# Patient Record
Sex: Male | Born: 2000
Health system: Southern US, Community
[De-identification: ages and names within clinical notes are randomized; demographics above are authoritative.]

## PROBLEM LIST (undated history)

## (undated) ENCOUNTER — Emergency Department (HOSPITAL_COMMUNITY): Payer: Commercial Indemnity | Source: Home / Self Care

## (undated) DIAGNOSIS — H6690 Otitis media, unspecified, unspecified ear: Secondary | ICD-10-CM

## (undated) DIAGNOSIS — R278 Other lack of coordination: Secondary | ICD-10-CM

## (undated) DIAGNOSIS — A419 Sepsis, unspecified organism: Secondary | ICD-10-CM

## (undated) DIAGNOSIS — F902 Attention-deficit hyperactivity disorder, combined type: Principal | ICD-10-CM

## (undated) DIAGNOSIS — F909 Attention-deficit hyperactivity disorder, unspecified type: Secondary | ICD-10-CM

## (undated) DIAGNOSIS — J45909 Unspecified asthma, uncomplicated: Secondary | ICD-10-CM

## (undated) DIAGNOSIS — F819 Developmental disorder of scholastic skills, unspecified: Secondary | ICD-10-CM

## (undated) DIAGNOSIS — R413 Other amnesia: Secondary | ICD-10-CM

## (undated) HISTORY — DX: Other lack of coordination: R27.8

## (undated) HISTORY — DX: Attention-deficit hyperactivity disorder, combined type: F90.2

## (undated) HISTORY — DX: Attention-deficit hyperactivity disorder, unspecified type: F90.9

---
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
## 2021-01-02 ENCOUNTER — Other Ambulatory Visit: Payer: Self-pay

## 2021-01-03 MED ORDER — AZSTARYS 52.3-10.4 MG PO CAPS
1.0000 | ORAL_CAPSULE | Freq: Every morning | ORAL | 0 refills | Status: DC
Start: 2021-01-03 — End: 2021-01-13

## 2021-01-03 NOTE — Telephone Encounter (Signed)
Azstarys 52.3-10.4 mg daily, #30 with no RF's.RX for above e-scribed and sent to pharmacy on record  [id number] - [place], [place] - [address] AT [address] OF [address]  [place] [phone] Phone: [phone] Fax: [phone]   

## 2021-01-06 ENCOUNTER — Telehealth: Payer: Self-pay

## 2021-01-10 ENCOUNTER — Telehealth: Payer: Self-pay | Admitting: Pediatrics

## 2021-01-10 NOTE — Telephone Encounter (Signed)
  Letter for [facility] to send for prior authorization for meds.

## 2021-01-13 ENCOUNTER — Other Ambulatory Visit: Payer: Self-pay

## 2021-01-13 ENCOUNTER — Ambulatory Visit: Payer: Commercial Managed Care - PPO | Admitting: Pediatrics

## 2021-01-13 ENCOUNTER — Encounter: Payer: Self-pay | Admitting: Pediatrics

## 2021-01-13 VITALS — Ht 72.5 in | Wt 179.0 lb

## 2021-01-13 DIAGNOSIS — Z719 Counseling, unspecified: Secondary | ICD-10-CM

## 2021-01-13 DIAGNOSIS — F902 Attention-deficit hyperactivity disorder, combined type: Secondary | ICD-10-CM | POA: Diagnosis not present

## 2021-01-13 DIAGNOSIS — Z7189 Other specified counseling: Secondary | ICD-10-CM

## 2021-01-13 DIAGNOSIS — Z79899 Other long term (current) drug therapy: Secondary | ICD-10-CM | POA: Diagnosis not present

## 2021-01-13 DIAGNOSIS — R278 Other lack of coordination: Secondary | ICD-10-CM

## 2021-01-13 MED ORDER — AZSTARYS 52.3-10.4 MG PO CAPS: 1.0000 | ORAL_CAPSULE | Freq: Every morning | ORAL | 0 refills | Status: DC

## 2021-01-13 NOTE — Progress Notes (Signed)
Medication Check  Patient ID: [name]  DOB: [birth year]  MRN: [id number]  DATE:01/13/21 Andre Ferretti, DO  Accompanied by: Father Patient Lives with: mother, father, and brother age 19  HISTORY/CURRENT STATUS: Chief Complaint - Polite and cooperative and present for medical follow up for medication management of ADHD, dysgraphia and learning differences. Last follow up in person on 07/12/20 and by video on 10/01/20. Currently prescribe Azstarys 52.3 mg and Intuniv 3 mg. Reports daily medicaiton.  Off Azstarys by five days due to lapse in PA on insurance.  Approval letter received today.   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 Website emailed  Activities/ Exercise: daily Outside, goes to gym  Screen time: (phone, tablet, TV, computer): not excessive Counseled to reduce  Driving: not yet licensed, no permit.  Just planning on getting license.  MEDICAL HISTORY: Appetite: WNL   Sleep: Bedtime: 0300 usually earlier around 0100   Concerns: Initiation/Maintenance/Other: Asleep easily, sleeps through the night, feels well-rested.  No Sleep concerns.  Elimination: no concerns  Individual Medical History/ Review of Systems: Changes?  Has had on/off sickness with SOB - using an inhaler  Family Medical/ Social History: Changes? No  MENTAL HEALTH: Denies sadness, loneliness or depression.  Denies self harm or thoughts of self harm or injury. Denies  fears, worries and anxieties. Has good peer relations and is not a bully nor is victimized. Steady girl friend of three months   PHYSICAL EXAM; Vitals:   01/13/21 1509  Weight: 179 lb (81.2 kg)  Height: 6' 0.5" (1.842 m)   Body mass index is 23.94 kg/m.  General Physical Exam: Unchanged from previous exam, date:07/12/2020   ASSESSMENT:  [name] is a 20 year old with a diagnosis of ADHD/Dysgraphis that is well controlled with current medication.   Has had insurance change and unable to get azstarys due to denied PA.  Appeal letter written 01/10/21 and approved on 01/13/21. Copy to father to bring to Pharmacy to get Rx filled.  He may also wish to get reimbursed for full price of meds since January. Rising college, emailed [employer] website to family to help [name] up accommodations for next school year. Counseled to continue the following: Safe sex/reduce risk behaviors Screen time reduction Improve sleep - really, parents needs to [name] limits and schedules  ADHD stable with medication management  DIAGNOSES:    ICD-10-CM   1. ADHD (attention deficit hyperactivity disorder), combined type  F90.2     2. Dysgraphia  R27.8     3. Medication management  Z79.899     4. Patient counseled  Z71.9     5. Parenting dynamics counseling  Z71.89       RECOMMENDATIONS:  Patient Instructions  DISCUSSION: Counseled regarding the following coordination of care items:  Continue medication as directed Azstarys 52.6 every morning Intuniv 3 mg every morning RX for above e-scribed and sent to pharmacy on record  [facility][id number] - [place], [place] - [address] AT [facility] OF [address] [place] [address] Phone: [phone] Fax: [phone]  Emailed [employer] information to patient and parents.  Father verbalized understanding of all topics discussed.  NEXT APPOINTMENT:  Return in about 3 months (around 04/15/2021) for Medication Check.  Disclaimer: This documentation was generated through the use of dictation and/or voice recognition software, and as such, may contain spelling or other transcription errors. Please disregard  any inconsequential errors.  Any questions regarding the content of this documentation should be directed to the individual who electronically signed.

## 2021-01-13 NOTE — Patient Instructions (Signed)
DISCUSSION: Counseled regarding the following coordination of care items:  Continue medication as directed Azstarys 52.6 every morning Intuniv 3 mg every morning RX for above e-scribed and sent to pharmacy on record  [facility][id number] - [place], [place] - [address] AT [facility] [address] [place] [address] Phone: [phone] Fax: [phone]  Emailed [employer] information to patient and parents.

## 2021-02-11 ENCOUNTER — Other Ambulatory Visit: Payer: Self-pay

## 2021-02-12 MED ORDER — AZSTARYS 52.3-10.4 MG PO CAPS: 1.0000 | ORAL_CAPSULE | Freq: Every morning | ORAL | 0 refills | Status: DC

## 2021-02-12 NOTE — Telephone Encounter (Signed)
RX for above e-scribed and sent to pharmacy on record  [id number] - [place], [place] - [address] AT [address] OF [address] [name]  [address] Phone: [phone] Fax: [phone]   

## 2021-03-05 ENCOUNTER — Encounter: Payer: Self-pay | Admitting: Pediatrics

## 2021-03-14 ENCOUNTER — Other Ambulatory Visit: Payer: Self-pay

## 2021-03-14 ENCOUNTER — Ambulatory Visit: Payer: Commercial Managed Care - PPO | Admitting: Pulmonary Disease

## 2021-03-14 ENCOUNTER — Encounter: Payer: Self-pay | Admitting: Pulmonary Disease

## 2021-03-14 VITALS — BP 124/80 | HR 78 | Temp 98.2°F | Ht 72.0 in | Wt 183.4 lb

## 2021-03-14 DIAGNOSIS — J4520 Mild intermittent asthma, uncomplicated: Secondary | ICD-10-CM

## 2021-03-14 MED ORDER — BUDESONIDE 90 MCG/ACT IN AEPB: 2.0000 | INHALATION_SPRAY | Freq: Two times a day (BID) | RESPIRATORY_TRACT | 6 refills | Status: AC

## 2021-03-14 NOTE — Progress Notes (Signed)
@Patient  ID: , male    DOB: Sep 13, 2000, 20 y.o.   MRN: [id number]  Chief Complaint  Patient presents with   Follow-up    3 mo f/u. States his breathing has been stable since last visit.     Referring provider: [id number], DO  HPI:   20 year old with history of asthma whom we are seeing in follow up of asthma.  Using Symbicort mid dose. Doing well. No issues. Exercising without issue. Had begun to improve before resuming symbicort at last visit 12/2020.  No cough.  No nocturnal symptoms.  Not using rescue inhaler.  No complaints.  HPI at initial visit: Patient notes onset of worsening dyspnea on exertion over the last 2 to 3 months.  Associate with chest tightness.  Occasional symptoms at rest.  Almost exclusively with exertion.  Such as working out, exercise.  Has used albuterol with some improvement.  Had continued symptoms particularly when working out with friends.  PCP advised using Symbicort.  He used 1 dose of this.  Serendipitously, his symptoms started to improve a couple weeks ago around the time of this intervention.  He has not been using it.  He is back to baselin those.  Worked out several times over the last couple weeks with friends.  Does think the probably contributed to worsening of his symptoms.  PMH: seasonal allergies, asthma Surgical History: appendectomy Family History: mother with NF3 Social History: does not smoke cigarettes, Senior in high school, planning to attend [employer]   ACT:  No flowsheet data found.  MMRC: mMRC Dyspnea Scale mMRC Score  12/11/2020 1    Epworth:  No flowsheet data found.  Tests:   FENO:  No results found for: NITRICOXIDE  PFT: No flowsheet data found.  WALK:  No flowsheet data found.  Imaging: Personally reviewed and as per EMR  Lab Results: Personally reviewed, no elevated eosinophils CBC    Component Value Date/Time   WBC 13.2 11/07/2017 2120   RBC 4.62 11/07/2017 2120   HGB 13.5 11/07/2017 2120    HCT 40.3 11/07/2017 2120   PLT 189 11/07/2017 2120   MCV 87.2 11/07/2017 2120   MCH 29.2 11/07/2017 2120   MCHC 33.5 11/07/2017 2120   RDW 13.0 11/07/2017 2120   LYMPHSABS 2.3 11/07/2017 2120   MONOABS 0.9 11/07/2017 2120   EOSABS 0.1 11/07/2017 2120   BASOSABS 0.0 11/07/2017 2120    BMET    Component Value Date/Time   NA 138 11/07/2017 2120   K 3.9 11/07/2017 2120   CL 105 11/07/2017 2120   CO2 25 11/07/2017 2120   GLUCOSE 97 11/07/2017 2120   BUN 13 11/07/2017 2120   CREATININE 0.90 11/07/2017 2120   CALCIUM 9.2 11/07/2017 2120   GFRNONAA NOT CALCULATED 11/07/2017 2120   GFRAA NOT CALCULATED 11/07/2017 2120    BNP No results found for: BNP  ProBNP No results found for: PROBNP  Specialty Problems   None  No Known Allergies   There is no immunization history on file for this patient.  Past Medical History:  Diagnosis Date   ADHD (attention deficit hyperactivity disorder)    ADHD (attention deficit hyperactivity disorder), combined type 12/05/2015   Asthma    Dysgraphia 11/21/2015   Learning difficulty    Memory deficit    Otitis    Sepsis (HCC)    neonatal, hospitalized for two weeks at 20 days of age    Tobacco History: Social History   Tobacco Use  Smoking Status  Never  Smokeless Tobacco Never   Counseling given: Not Answered   Continue to not smoke  Outpatient Encounter Medications as of 03/14/2021  Medication Sig   albuterol (VENTOLIN HFA) 108 (90 Base) MCG/ACT inhaler SMARTSIG:1 Puff(s) Via Inhaler Every 4 Hours PRN   Budesonide 90 MCG/ACT inhaler Inhale 2 puffs into the lungs 2 (two) times daily.   fexofenadine (ALLEGRA) 180 MG tablet 1 tablet, stop claritin,   GuanFACINE HCl 3 MG TB24 TAKE ONE TABLET EACH DAY   Serdexmethylphen-Dexmethylphen (AZSTARYS) 52.3-10.4 MG CAPS Take 1 capsule by mouth every morning.   [DISCONTINUED] SYMBICORT 80-4.5 MCG/ACT inhaler Inhale 2 puffs into the lungs 2 (two) times daily.   No facility-administered  encounter medications on file as of 03/14/2021.     Review of Systems  Review of Systems  N/a  Physical Exam  BP 124/80 (BP Location: Right Arm, Patient Position: Sitting, Cuff Size: Normal)   Pulse 78   Temp 98.2 F (36.8 C) (Oral)   Ht 6' (1.829 m)   Wt 183 lb 6.4 oz (83.2 kg)   SpO2 97% Comment: on RA  BMI 24.87 kg/m   Wt Readings from Last 5 Encounters:  03/14/21 183 lb 6.4 oz (83.2 kg) (85 %, Z= 1.02)*  12/11/20 188 lb 9.6 oz (85.5 kg) (88 %, Z= 1.19)*  05/28/20 177 lb 14.6 oz (80.7 kg) (83 %, Z= 0.95)*  11/08/17 144 lb 2.9 oz (65.4 kg) (66 %, Z= 0.41)*  09/03/14 100 lb 1.4 oz (45.4 kg) (54 %, Z= 0.10)*   * Growth percentiles are based on CDC (Boys, 2-20 Years) data.    BMI Readings from Last 5 Encounters:  03/14/21 24.87 kg/m (74 %, Z= 0.64)*  12/11/20 25.58 kg/m (80 %, Z= 0.86)*  05/28/20 23.96 kg/m (71 %, Z= 0.54)*  11/08/17 19.55 kg/m (35 %, Z= -0.37)*   * Growth percentiles are based on CDC (Boys, 2-20 Years) data.     Physical Exam General: Well appearing, in NAD Eyes: EOMI, no icterus Neck: Supple, no JVD Cardiovascular: Regular rhythm, no murmur Pulmonary: Clear to auscultation bilaterally, normal work of breathing Abdomen: Nondistended, bowel sounds present MSK: No synovitis, joint effusion Neuro: Normal gait, no weakness Psych: Normal mood, full affect   Assessment & Plan:   Episodic DOE: in setting of known asthma, worse with pollen season. Improved slightly with albuterol. Has not required steroids. Not using Symbicort.  Improved spontaneously.  Continue Symbicort with ongoing stability and improvement.  Resolved.  Asthma: Has been stable for 3 months on LABA/ICS.  Normally would want to see longer duration of stability but given symptoms seem to spontaneously improve prior to resuming ICS/LABA, recommend stepdown to ICS alone.  If symptoms stable in neck several months anticipate stopping maintenance inhaler.   Return if symptoms worsen  or fail to improve.   Karren Burly, MD 03/14/2021

## 2021-03-14 NOTE — Patient Instructions (Addendum)
Good to see you!  Stop Symbicort  Use budesonide (same steroid as symbicort) 2 puffs twice a day. Rinse mouth after every use. I sent a new prescription for this.  Please call the office after exams and let me know how you are doing. If doing well we can plan on likely stopping the inhaler at that time.  If breathing worsens at any time please call us.

## 2021-03-17 ENCOUNTER — Other Ambulatory Visit: Payer: Self-pay | Admitting: Pediatrics

## 2021-03-17 ENCOUNTER — Other Ambulatory Visit: Payer: Self-pay

## 2021-03-17 MED ORDER — GUANFACINE HCL ER 3 MG PO TB24: ORAL_TABLET | ORAL | 0 refills | Status: DC

## 2021-03-17 MED ORDER — AZSTARYS 52.3-10.4 MG PO CAPS: 1.0000 | ORAL_CAPSULE | Freq: Every morning | ORAL | 0 refills | Status: DC

## 2021-03-17 NOTE — Telephone Encounter (Signed)
RX for above e-scribed and sent to pharmacy on record  [id number] - [place], [place] - [address] [place] [address] Phone: [phone] Fax: [phone]   

## 2021-03-17 NOTE — Telephone Encounter (Signed)
RX for above e-scribed and sent to pharmacy on record  [id number] - [place], [place] - [address] AT [address]  [place] [address] Phone: [phone] Fax: [phone]   

## 2021-04-15 ENCOUNTER — Other Ambulatory Visit: Payer: Self-pay

## 2021-04-15 MED ORDER — AZSTARYS 52.3-10.4 MG PO CAPS: 1.0000 | ORAL_CAPSULE | Freq: Every morning | ORAL | 0 refills | Status: DC

## 2021-04-15 NOTE — Telephone Encounter (Signed)
Azstarys 52.3-10. Mg daily # 30 with no RF's.RX for above e-scribed and sent to pharmacy on record  [id number] - [place], [place] - [address] AT [facility] OF [address] [place] [address] Phone: [phone] Fax: [phone]

## 2021-04-21 ENCOUNTER — Other Ambulatory Visit: Payer: Self-pay

## 2021-04-21 ENCOUNTER — Ambulatory Visit: Payer: Commercial Managed Care - PPO | Admitting: Pediatrics

## 2021-04-21 ENCOUNTER — Encounter: Payer: Self-pay | Admitting: Pediatrics

## 2021-04-21 VITALS — Ht 72.5 in | Wt 177.0 lb

## 2021-04-21 DIAGNOSIS — R278 Other lack of coordination: Secondary | ICD-10-CM | POA: Diagnosis not present

## 2021-04-21 DIAGNOSIS — Z7189 Other specified counseling: Secondary | ICD-10-CM

## 2021-04-21 DIAGNOSIS — Z79899 Other long term (current) drug therapy: Secondary | ICD-10-CM | POA: Diagnosis not present

## 2021-04-21 DIAGNOSIS — F902 Attention-deficit hyperactivity disorder, combined type: Secondary | ICD-10-CM | POA: Diagnosis not present

## 2021-04-21 DIAGNOSIS — Z719 Counseling, unspecified: Secondary | ICD-10-CM

## 2021-04-21 NOTE — Progress Notes (Signed)
Medication Check  Patient ID: [name]  DOB: [birth year]  MRN: [id number]  DATE:04/21/21 Charlane Ferretti, DO  Accompanied by: Self Patient Lives with: Parents and brother 1 at home Currently on campus traditional dormitory style  HISTORY/CURRENT STATUS: Chief Complaint - Polite and cooperative and present for medical follow up for medication management of ADHD, dysgraphia and learning differences.  Last follow-up 01/13/2021.  Currently prescribed azstarys 52.3 mg and Intuniv 3 mg daily requesting no changes.   EDUCATION: School: [employer] Year/Grade: Freshman  Dropped Spanish, will do cultural stuff instead. Has made some friends Goes to gym, making music  Service plan: DSO, and counselor  Activities/ Exercise: daily  Screen time: (phone, tablet, TV, computer): not excessive. May watch a video or two before bed  Driving: going well no issues  MEDICAL HISTORY: Appetite: WNL   Sleep: Bedtime: averages around 8 hours of sleep, less on weekend due to up late  Elimination: No concerns  Individual Medical History/ Review of Systems: Changes? :No  Family Medical/ Social History: Changes? No  MENTAL HEALTH: No concerns  PHYSICAL EXAM; Vitals:   04/21/21 0836  Weight: 177 lb (80.3 kg)  Height: 6' 0.5" (1.842 m)   Body mass index is 23.68 kg/m.  ASSESSMENT:  [name] is a 20 year old with a diagnosis of ADHD/dysgraphia with continued learning differences requiring disability services in college and support.  Recent drop of Spanish and multicultural classes.  Doing well upon his transition to independent college living.  We discussed maintaining good routines with taking medication daily and not interrupting sleep routines.  Maximizing good sleep schedules as well as life work balance.  Is assimilating well with good friend group.  I do recommend decreasing all screen time and maintaining good schedules for work, sleep, daily routines.  Good dietary choices avoiding junk food and  empty calories.  More protein rich foods.  ADHD stable with medication management Utilize school services to include counseling.  May also wish to keep in touch with a local counselor Dr. Melvyn Neth for check ins with regards to socialization and executive function maturity.  DIAGNOSES:    ICD-10-CM   1. ADHD (attention deficit hyperactivity disorder), combined type  F90.2     2. Dysgraphia  R27.8     3. Medication management  Z79.899     4. Patient counseled  Z71.9     5. Parenting dynamics counseling  Z71.89       RECOMMENDATIONS:  Patient Instructions  DISCUSSION: Counseled regarding the following coordination of care items:  Continue medication as directed Azstarys 52.3 mg every morning Intuniv 3 mg every morning Recently submitted no refills this date  Advised importance of:  Sleep Maintain good sleep routines Limited screen time (none on school nights, no more than 2 hours on weekends) Reduce all screen time Regular exercise(outside and active play) More physical active physical outside time Healthy eating (drink water, no sodas/sweet tea) Protein rich avoid junk food and empty calories     Patient verbalized understanding of all topics discussed.  NEXT APPOINTMENT:  Return in about 6 months (around 10/19/2021) for Medication Check.  Disclaimer: This documentation was generated through the use of dictation and/or voice recognition software, and as such, may contain spelling or other transcription errors. Please disregard any inconsequential errors.  Any questions regarding the content of this documentation should be directed to the individual who electronically signed.

## 2021-04-21 NOTE — Patient Instructions (Addendum)
DISCUSSION: Counseled regarding the following coordination of care items:  Continue medication as directed Azstarys 52.3 mg every morning Intuniv 3 mg every morning Recently submitted no refills this date  Advised importance of:  Sleep Maintain good sleep routines Limited screen time (none on school nights, no more than 2 hours on weekends) Reduce all screen time Regular exercise(outside and active play) More physical active physical outside time Healthy eating (drink water, no sodas/sweet tea) Protein rich avoid junk food and empty calories

## 2021-05-05 ENCOUNTER — Other Ambulatory Visit: Payer: Self-pay

## 2021-05-05 ENCOUNTER — Ambulatory Visit (INDEPENDENT_AMBULATORY_CARE_PROVIDER_SITE_OTHER): Payer: Commercial Managed Care - PPO | Admitting: Psychologist

## 2021-05-05 ENCOUNTER — Encounter: Payer: Self-pay | Admitting: Psychologist

## 2021-05-05 DIAGNOSIS — F411 Generalized anxiety disorder: Secondary | ICD-10-CM | POA: Diagnosis not present

## 2021-05-05 DIAGNOSIS — F902 Attention-deficit hyperactivity disorder, combined type: Secondary | ICD-10-CM | POA: Diagnosis not present

## 2021-05-05 DIAGNOSIS — F810 Specific reading disorder: Secondary | ICD-10-CM

## 2021-05-05 NOTE — Progress Notes (Signed)
  [facility] [address] [place] [address] Dept: [phone] Dept Fax: [phone] Loc: [phone] Loc Fax: [phone]  Psychology Therapy Session Progress Note  Patient ID: [name], male  DOB: 12-Sep-2000, 20 y.o.  MRN: [id number]  05/05/2021 Start time: 10 AM End time: 10:50 AM  Session #: In office psychotherapy session  Present: patient  Service provided: [id number] Individual Psychotherapy (45 min.)  Current Concerns: Moderate levels of anxiety secondary to relationship issues.  Girlfriend diagnosed with anorexia.  Trying to navigate a long distance relationship with her in [place] and him in [place].  Beginning to have low level panic attacks.  Current Symptoms: Anxiety and Panic attacks  Mental Status: Appearance: Well Groomed Attention: good  Motor Behavior: Normal Affect: Full Range Mood: anxious Thought Process: normal Thought Content: normal Suicidal Ideation: None Homicidal Ideation:None Orientation: time, place, and person Insight: Fair Judgement: Good  Diagnosis: Generalized anxiety disorder with panic attacks, history of ADHD, dyslexia  Long Term Treatment Goals:  1) decrease anxiety 2) resist flight/freeze response 3) identify anxiety inducing thoughts 4) use relaxation strategies (deep breathing, visualization, cognitive cueing, muscle relaxation)  1) decrease impulsivity 2) increase self-monitoring 3) increase organizational skills 4) increase time management skills 5) increased behavioral regulation 6) increase self-monitoring 7) utilized cognitive behavioral principles   Anticipated Frequency of Visits: As needed Anticipated Length of Treatment Episode: As needed  Treatment Intervention: Cognitive Behavioral therapy  Response to Treatment: Neutral  Medical Necessity: Improved patient  condition  Plan: CBT as needed  RJolene Provost 05/05/2021

## 2021-05-23 ENCOUNTER — Other Ambulatory Visit: Payer: Self-pay

## 2021-05-23 MED ORDER — AZSTARYS 52.3-10.4 MG PO CAPS: 1.0000 | ORAL_CAPSULE | Freq: Every morning | ORAL | 0 refills | Status: DC

## 2021-05-23 NOTE — Telephone Encounter (Signed)
Azstarys 52.3-10.4 mg daily, #30 with no RF's.RX for above e-scribed and sent to pharmacy on record  [id number] - [place], [place] - [address] [place] [address] Phone: [phone] Fax: [phone]   

## 2021-06-18 ENCOUNTER — Other Ambulatory Visit: Payer: Self-pay

## 2021-06-18 MED ORDER — GUANFACINE HCL ER 3 MG PO TB24: ORAL_TABLET | ORAL | 0 refills | Status: DC

## 2021-06-18 MED ORDER — AZSTARYS 52.3-10.4 MG PO CAPS: 1.0000 | ORAL_CAPSULE | Freq: Every morning | ORAL | 0 refills | Status: DC

## 2021-06-18 NOTE — Telephone Encounter (Signed)
RX for above e-scribed and sent to pharmacy on record  [id number] - [place], [place] - [address] AT [address] [place] [address] Phone: [phone] Fax: [phone]   

## 2021-08-05 ENCOUNTER — Other Ambulatory Visit: Payer: Self-pay

## 2021-08-05 MED ORDER — AZSTARYS 52.3-10.4 MG PO CAPS: 1.0000 | ORAL_CAPSULE | Freq: Every morning | ORAL | 0 refills | Status: DC

## 2021-08-05 NOTE — Telephone Encounter (Signed)
RX for above e-scribed and sent to pharmacy on record  [id number] - [place], [place] - [address] [place]  [address] Phone: [phone] Fax: [phone]   

## 2021-08-11 ENCOUNTER — Telehealth: Payer: Self-pay

## 2021-08-13 NOTE — Telephone Encounter (Signed)
Outcome °Approvedtoday °Effective from 08/11/2021 through 08/10/2022. °

## 2021-08-14 DIAGNOSIS — R7989 Other specified abnormal findings of blood chemistry: Secondary | ICD-10-CM | POA: Diagnosis not present

## 2021-08-14 DIAGNOSIS — J4541 Moderate persistent asthma with (acute) exacerbation: Secondary | ICD-10-CM | POA: Diagnosis not present

## 2021-08-19 DIAGNOSIS — Z0000 Encounter for general adult medical examination without abnormal findings: Secondary | ICD-10-CM | POA: Diagnosis not present

## 2021-08-19 DIAGNOSIS — Z1331 Encounter for screening for depression: Secondary | ICD-10-CM | POA: Diagnosis not present

## 2021-08-19 DIAGNOSIS — Z23 Encounter for immunization: Secondary | ICD-10-CM | POA: Diagnosis not present

## 2021-08-19 DIAGNOSIS — Z1339 Encounter for screening examination for other mental health and behavioral disorders: Secondary | ICD-10-CM | POA: Diagnosis not present

## 2021-08-19 DIAGNOSIS — J45909 Unspecified asthma, uncomplicated: Secondary | ICD-10-CM | POA: Diagnosis not present

## 2021-08-21 ENCOUNTER — Other Ambulatory Visit: Payer: Self-pay | Admitting: Pediatrics

## 2021-08-21 MED ORDER — AZSTARYS 52.3-10.4 MG PO CAPS: 1.0000 | ORAL_CAPSULE | Freq: Every morning | ORAL | 0 refills | Status: DC

## 2021-09-23 DIAGNOSIS — J45909 Unspecified asthma, uncomplicated: Secondary | ICD-10-CM | POA: Diagnosis not present

## 2021-09-26 ENCOUNTER — Other Ambulatory Visit: Payer: Self-pay | Admitting: Pediatrics

## 2021-09-26 NOTE — Telephone Encounter (Signed)
RX for above e-scribed and sent to pharmacy on record  [id number] - [place], [place] - [address] AT [address] [place] [address] Phone: [phone] Fax: [phone]   

## 2021-10-02 ENCOUNTER — Other Ambulatory Visit: Payer: Self-pay

## 2021-10-02 MED ORDER — AZSTARYS 52.3-10.4 MG PO CAPS: 1.0000 | ORAL_CAPSULE | Freq: Every morning | ORAL | 0 refills | Status: DC

## 2021-10-02 NOTE — Telephone Encounter (Signed)
RX for above e-scribed and sent to pharmacy on record  [id number] - [place], [place] - [address] [place] [address] Phone: [phone] Fax: [phone]   

## 2021-10-04 ENCOUNTER — Emergency Department (HOSPITAL_BASED_OUTPATIENT_CLINIC_OR_DEPARTMENT_OTHER): Payer: BC Managed Care – PPO | Admitting: Radiology

## 2021-10-04 ENCOUNTER — Other Ambulatory Visit: Payer: Self-pay

## 2021-10-04 ENCOUNTER — Emergency Department (HOSPITAL_BASED_OUTPATIENT_CLINIC_OR_DEPARTMENT_OTHER)
Admission: EM | Admit: 2021-10-04 | Discharge: 2021-10-04 | Disposition: A | Discharge status: Home / Self Care | Payer: BC Managed Care – PPO | Attending: Emergency Medicine | Admitting: Emergency Medicine

## 2021-10-04 ENCOUNTER — Emergency Department (HOSPITAL_BASED_OUTPATIENT_CLINIC_OR_DEPARTMENT_OTHER): Payer: BC Managed Care – PPO

## 2021-10-04 DIAGNOSIS — D72829 Elevated white blood cell count, unspecified: Secondary | ICD-10-CM | POA: Diagnosis not present

## 2021-10-04 DIAGNOSIS — E871 Hypo-osmolality and hyponatremia: Secondary | ICD-10-CM | POA: Insufficient documentation

## 2021-10-04 DIAGNOSIS — J45909 Unspecified asthma, uncomplicated: Secondary | ICD-10-CM | POA: Insufficient documentation

## 2021-10-04 DIAGNOSIS — Z7951 Long term (current) use of inhaled steroids: Secondary | ICD-10-CM | POA: Diagnosis not present

## 2021-10-04 DIAGNOSIS — R000 Tachycardia, unspecified: Secondary | ICD-10-CM | POA: Diagnosis not present

## 2021-10-04 DIAGNOSIS — R824 Acetonuria: Secondary | ICD-10-CM | POA: Insufficient documentation

## 2021-10-04 DIAGNOSIS — R809 Proteinuria, unspecified: Secondary | ICD-10-CM | POA: Diagnosis not present

## 2021-10-04 DIAGNOSIS — R0602 Shortness of breath: Secondary | ICD-10-CM | POA: Diagnosis not present

## 2021-10-04 DIAGNOSIS — R531 Weakness: Secondary | ICD-10-CM | POA: Diagnosis not present

## 2021-10-04 DIAGNOSIS — Z20822 Contact with and (suspected) exposure to covid-19: Secondary | ICD-10-CM | POA: Insufficient documentation

## 2021-10-04 DIAGNOSIS — J029 Acute pharyngitis, unspecified: Secondary | ICD-10-CM | POA: Insufficient documentation

## 2021-10-04 DIAGNOSIS — R197 Diarrhea, unspecified: Secondary | ICD-10-CM | POA: Diagnosis not present

## 2021-10-04 DIAGNOSIS — E860 Dehydration: Secondary | ICD-10-CM | POA: Diagnosis not present

## 2021-10-04 DIAGNOSIS — J0510 Acute epiglottitis without obstruction: Secondary | ICD-10-CM | POA: Diagnosis not present

## 2021-10-04 LAB — BASIC METABOLIC PANEL WITH GFR
Anion gap: 10 (ref 5–15)
BUN: 11 mg/dL (ref 6–20)
CO2: 27 mmol/L (ref 22–32)
Calcium: 9.7 mg/dL (ref 8.9–10.3)
Chloride: 96 mmol/L — ABNORMAL LOW (ref 98–111)
Creatinine, Ser: 1.03 mg/dL (ref 0.61–1.24)
GFR, Estimated: >60 mL/min
Glucose, Bld: 114 mg/dL — ABNORMAL HIGH (ref 70–99)
Potassium: 3.5 mmol/L (ref 3.5–5.1)
Sodium: 133 mmol/L — ABNORMAL LOW (ref 135–145)

## 2021-10-04 LAB — MONONUCLEOSIS SCREEN: Mono Screen: NEGATIVE

## 2021-10-04 LAB — URINALYSIS, ROUTINE W REFLEX MICROSCOPIC
Bilirubin Urine: NEGATIVE
Glucose, UA: NEGATIVE mg/dL
Ketones, ur: 15 mg/dL — AB
Leukocytes,Ua: NEGATIVE
Nitrite: NEGATIVE
Specific Gravity, Urine: 1.044 — ABNORMAL HIGH (ref 1.005–1.030)
pH: 6.5 (ref 5.0–8.0)

## 2021-10-04 LAB — CBC
HCT: 39.3 % (ref 39.0–52.0)
Hemoglobin: 13.4 g/dL (ref 13.0–17.0)
MCH: 29 pg (ref 26.0–34.0)
MCHC: 34.1 g/dL (ref 30.0–36.0)
MCV: 85.1 fL (ref 80.0–100.0)
Platelets: 188 K/uL (ref 150–400)
RBC: 4.62 MIL/uL (ref 4.22–5.81)
RDW: 12.4 % (ref 11.5–15.5)
WBC: 17.3 K/uL — ABNORMAL HIGH (ref 4.0–10.5)
nRBC: 0 % (ref 0.0–0.2)

## 2021-10-04 LAB — RESP PANEL BY RT-PCR (FLU A&B, COVID) ARPGX2
Influenza A by PCR: NEGATIVE
Influenza B by PCR: NEGATIVE
SARS Coronavirus 2 by RT PCR: NEGATIVE

## 2021-10-04 MED ORDER — CLINDAMYCIN HCL 150 MG PO CAPS: 150.0000 mg | ORAL_CAPSULE | Freq: Three times a day (TID) | ORAL | 0 refills | Status: DC

## 2021-10-04 MED ORDER — DEXAMETHASONE SODIUM PHOSPHATE 10 MG/ML IJ SOLN
10.0000 mg | Freq: Once | INTRAMUSCULAR | Status: AC
  Administered 2021-10-04: 10 mg via INTRAVENOUS
  Filled 2021-10-04: qty 1

## 2021-10-04 MED ORDER — ACETAMINOPHEN 325 MG PO TABS
650.0000 mg | ORAL_TABLET | Freq: Once | ORAL | Status: AC
  Administered 2021-10-04: 650 mg via ORAL
  Filled 2021-10-04: qty 2

## 2021-10-04 MED ORDER — IOHEXOL 300 MG/ML  SOLN
100.0000 mL | Freq: Once | INTRAMUSCULAR | Status: AC | PRN
  Administered 2021-10-04: 75 mL via INTRAVENOUS

## 2021-10-04 MED ORDER — SODIUM CHLORIDE 0.9 % IV BOLUS
1000.0000 mL | Freq: Once | INTRAVENOUS | Status: AC
Start: 2021-10-04 — End: 2021-10-04
  Administered 2021-10-04: 1000 mL via INTRAVENOUS

## 2021-10-04 NOTE — ED Triage Notes (Signed)
Pt reports recently having strep and completed abx. Endorses fevers, weakness and SOB. Reports recent weight loss.  ?

## 2021-10-04 NOTE — Discharge Instructions (Signed)
Please use Tylenol or ibuprofen for pain.  You may use 600 mg ibuprofen every 6 hours or 1000 mg of Tylenol every 6 hours.  You may choose to alternate between the 2.  This would be most effective.  Not to exceed 4 g of Tylenol within 24 hours.  Not to exceed 3200 mg ibuprofen 24 hours. ? ?The Tylenol should also help with fever.  Encourage you to try to eat as much as you can, drink plenty of fluids, including Pedialyte, Gatorade to make sure that your electrolytes do not become depleted.  Please take the entire course of antibiotics that I am prescribing, this particular antibiotic can cause some stomach upset, I recommend that you take it with a probiotic. ? ?If you have significant worsening of your symptoms please return to the emergency department for further evaluation or if you have a fever that does not respond to Tylenol or ibuprofen.  Otherwise I recommend that you follow-up with your primary care doctor sometime next week to ensure that your symptoms are improving.  It was a pleasure taking care of you today, I hope that you feel better soon. ?

## 2021-10-04 NOTE — ED Notes (Signed)
Patient transported to CT 

## 2021-10-04 NOTE — ED Provider Notes (Signed)
[facility] EMERGENCY DEPT Provider Note   CSN: [id number] Arrival date & time: 10/04/21  1706     History  Chief Complaint  Patient presents with   Weakness    [name] is a 21 y.o. male with a past medical history significant for asthma, and recent treatment for strep pharyngitis who presents with 15 pound weight loss over the last 3 weeks, fever, chills, feeling of weakness, and ongoing sore throat.  He also had new onset of diarrhea over the last 2 days.  He denies any blood in stool, significant abdominal pain, chest pain.  He does report that he has been feeling slightly more short of breath, has been using his inhaler more than normal.  Patient with fever of 102.1 prior to arrival, decreased to 99.4 after administration of Tylenol.   Weakness     Home Medications Prior to Admission medications   Medication Sig Start Date End Date Taking? Authorizing Provider  clindamycin (CLEOCIN) 150 MG capsule Take 1 capsule (150 mg total) by mouth 3 (three) times daily. 10/04/21  Yes Ronan Duecker H, PA-C  albuterol (VENTOLIN HFA) 108 (90 Base) MCG/ACT inhaler SMARTSIG:1 Puff(s) Via Inhaler Every 4 Hours PRN 10/03/20   [provider]  Budesonide 90 MCG/ACT inhaler Inhale 2 puffs into the lungs 2 (two) times daily. 03/14/21   Hunsucker, Lesia Sago, MD  fexofenadine (ALLEGRA) 180 MG tablet 1 tablet, stop claritin,    [provider]  GuanFACINE HCl 3 MG TB24 TAKE 1 TABLET BY MOUTH EACH DAY 09/26/21   Crump, Bobi A, NP  Serdexmethylphen-Dexmethylphen (AZSTARYS) 52.3-10.4 MG CAPS Take 1 capsule by mouth every morning. 10/02/21   Leticia Penna, NP      Allergies    Patient has no known allergies.    Review of Systems   Review of Systems  Neurological:  Positive for weakness.  All other systems reviewed and are negative.  Physical Exam Updated Vital Signs BP (!) 140/92    Pulse (!) 101    Temp 99.4 F (37.4 C) (Oral)    Resp 20    Ht 6' 0.5" (1.842 m)     Wt 77.1 kg    SpO2 99%    BMI 22.74 kg/m  Physical Exam Vitals and nursing note reviewed.  Constitutional:      General: He is not in acute distress.    Appearance: Normal appearance.     Comments: Somewhat pale, ill-appearing  HENT:     Head: Normocephalic and atraumatic.     Mouth/Throat:     Comments: Tonsils are 1-2+ bilaterally with no exudate.  Posterior oropharynx is red, uvula is midline.  I do not see any evidence of peritonsillar abscess. Eyes:     General:        Right eye: No discharge.        Left eye: No discharge.  Neck:     Comments: Patient with some swelling, tenderness on the left side of the neck consistent with cervical adenopathy, but some clinical concern for additional mass in this location especially given patient's constellation of symptoms. Cardiovascular:     Rate and Rhythm: Normal rate and regular rhythm.     Heart sounds: No murmur heard.   No friction rub. No gallop.  Pulmonary:     Effort: Pulmonary effort is normal.     Breath sounds: Normal breath sounds.     Comments: Normal breath sounds throughout, no wheezing or stridor, rhonchi or rales Abdominal:  General: Bowel sounds are normal.     Palpations: Abdomen is soft.     Comments: No tenderness to palpation of the abdomen  Lymphadenopathy:     Cervical: Cervical adenopathy present.  Skin:    General: Skin is warm and dry.     Capillary Refill: Capillary refill takes less than 2 seconds.  Neurological:     Mental Status: He is alert and oriented to person, place, and time.  Psychiatric:        Mood and Affect: Mood normal.        Behavior: Behavior normal.    ED Results / Procedures / Treatments   Labs (all labs ordered are listed, but only abnormal results are displayed) Labs Reviewed  BASIC METABOLIC PANEL - Abnormal; Notable for the following components:      Result Value   Sodium 133 (*)    Chloride 96 (*)    Glucose, Bld 114 (*)    All other components within normal  limits  CBC - Abnormal; Notable for the following components:   WBC 17.3 (*)    All other components within normal limits  URINALYSIS, ROUTINE W REFLEX MICROSCOPIC - Abnormal; Notable for the following components:   Specific Gravity, Urine 1.044 (*)    Hgb urine dipstick TRACE (*)    Ketones, ur 15 (*)    Protein, ur TRACE (*)    Bacteria, UA RARE (*)    All other components within normal limits  RESP PANEL BY RT-PCR (FLU A&B, COVID) [id number]  MONONUCLEOSIS SCREEN    EKG EKG Interpretation  Date/Time:  Saturday October 04 2021 17:19:54 EST Ventricular Rate:  112 PR Interval:  139 QRS Duration: 111 QT Interval:  335 QTC Calculation: 458 R Axis:   73 Text Interpretation: Sinus tachycardia Right atrial enlargement Probable left ventricular hypertrophy Borderline T abnormalities, inferior leads No previous ECGs available Confirmed by Vanetta Mulders [phone]) on 10/04/2021 6:07:49 PM  Radiology DG Chest 2 View  Result Date: 10/04/2021 CLINICAL DATA:  Shortness of breath EXAM: CHEST - 2 VIEW COMPARISON:  None. FINDINGS: The heart size and mediastinal contours are within normal limits. Both lungs are clear. The visualized skeletal structures are unremarkable. IMPRESSION: No acute abnormality of the lungs. Electronically Signed   By: Jearld Lesch M.D.   On: 10/04/2021 18:42   CT Soft Tissue Neck W Contrast  Result Date: 10/04/2021 CLINICAL DATA:  Epiglottitis or tonsillitis suspected. Additional history provided: Fever. 15 pound weight loss in 3 weeks. EXAM: CT NECK WITH CONTRAST TECHNIQUE: Multidetector CT imaging of the neck was performed using the standard protocol following the bolus administration of intravenous contrast. RADIATION DOSE REDUCTION: This exam was performed according to the departmental dose-optimization program which includes automated exposure control, adjustment of the mA and/or kV according to patient size and/or use of iterative reconstruction technique. CONTRAST:  66mL  OMNIPAQUE IOHEXOL 300 MG/ML  SOLN COMPARISON:  None. FINDINGS: Mildly motion degraded exam. Pharynx and larynx: Streak and beam hardening artifact arising from dental restoration partially obscures the oral cavity. Symmetric prominence of the palatine and lingual tonsils. No appreciable swelling or discrete mass elsewhere within the oral cavity, pharynx or larynx. No retropharyngeal collection. Salivary glands: No inflammation, mass, or stone. Thyroid: Subcentimeter nodule within the right thyroid lobe, not meeting consensus criteria for ultrasound follow-up based on size. Lymph nodes: Bilateral cervical lymphadenopathy. For instance, a right level 2 lymph node measures 19 mm in short axis. A left level 2 lymph node measures 18 mm  in short axis. A left level 2 lymph node measures 16 mm in short axis. Vascular: The major vascular structures of the neck are patent. Limited intracranial: No evidence of acute intracranial abnormality within the field of view. Visualized orbits: No orbital mass or acute orbital finding. Mastoids and visualized paranasal sinuses: No significant paranasal sinus disease or mastoid effusion at the imaged levels. Skeleton: No acute bony abnormality or aggressive osseous lesion. Upper chest: No consolidation within the imaged lung apices. IMPRESSION: Mildly motion degraded exam. Symmetric prominence of the palatine and lingual tonsils. Associated bilateral cervical lymphadenopathy with lymph nodes measuring up to 19 mm in short axis. This constellation of findings is nonspecific and differential considerations include acute tonsillitis/pharyngitis with reactive lymphadenopathy, infectious mononucleosis or a lymphoproliferative process (such as lymphoma). Clinical correlation is recommended. Additionally, close clinical follow-up is recommended (with imaging follow-up as warranted) to ensure resolution and exclude alternative etiologies. Electronically Signed   By: Jackey Loge D.O.   On:  10/04/2021 18:50    Procedures Procedures    Medications Ordered in ED Medications  sodium chloride 0.9 % bolus 1,000 mL ( Intravenous Stopped 10/04/21 1953)  iohexol (OMNIPAQUE) 300 MG/ML solution 100 mL (75 mLs Intravenous Contrast Given 10/04/21 1830)  dexamethasone (DECADRON) injection 10 mg (10 mg Intravenous Given 10/04/21 1957)  acetaminophen (TYLENOL) tablet 650 mg (650 mg Oral Given 10/04/21 2041)    ED Course/ Medical Decision Making/ A&P                           Medical Decision Making Amount and/or Complexity of Data Reviewed Labs: ordered. Radiology: ordered.  Risk Prescription drug management.   I discussed this case with my attending physician who cosigned this note including patient's presenting symptoms, physical exam, and planned diagnostics and interventions. Attending physician stated agreement with plan or made changes to plan which were implemented.   Attending physician assessed patient at bedside  This patient presents to the ED for concern of general malaise, weight loss, persistent sore throat, fever, chills, increased inhaler use, this involves an extensive number of treatment options, and is a complaint that carries with it a high risk of complications and morbidity. The emergent differential diagnosis prior to evaluation includes, but is not limited to, adequately treated strep infection, peritonsillar abscess, mono, epiglottitis, worsening tonsillitis with airway compromise, asthma exacerbation, pneumonia versus other.  This is not an exhaustive differential.   Past Medical History / Co-morbidities: Recent previous strep infection  Additional history: Additional history obtained from patient's father. External records from outside source obtained and reviewed including outpatient student health visit.  Physical Exam: Physical exam performed. The pertinent findings include: posterior pharynx erythema, cervical adenopathy, somewhat pale, ill-appearing  patient, but intact swallow reflex, no trismus  Lab Tests: I ordered, and personally interpreted labs.  The pertinent results include: Mild hyponatremia sodium 133, high specific gravity, ketones, and protein in urine with dehydration.  We will replete fluids.  His RVP is negative for COVID, flu.  His monoscreen is negative.  His CBC is remarkable for leukocytosis with white blood cells at 17.3.   Imaging Studies: I ordered imaging studies including CT soft tissue neck with contrast, plain film chest x-ray. I independently visualized and interpreted imaging which showed nonspecific swelling consistent with tonsillitis, pharyngitis, but cannot put a definitive diagnosis, some concern for additional changes that could be consistent with lymphoma, however patient's clinical picture seems more infectious in nature.  His chest x-ray  shows no evidence of intrathoracic abnormality. I agree with the radiologist interpretation.   Medications: I ordered medication including Decadron, fluids, Tylenol for fever, throat swelling, dehydration. Reevaluation of the patient after these medicines showed that the patient improved. I have reviewed the patients home medicines and have made adjustments as needed.  Patient looks significantly better.  Discharged with almost complete resolution of his tachycardia.  He does have some return of elevated temperature with temp at 101 again prior to discharge, we will administer Tylenol before he goes home.  He does seem responsive to Tylenol as he was afebrile on arrival after home administration.  Disposition: After consideration of the diagnostic results and the patients response to treatment, I feel that patient clinical condition is consistent with a possible persistent bacterial infection of the pharynx, with no evidence of peritonsillar abscess, epiglottitis, acute respiratory failure, trismus.  He does not appear to be having an asthma exacerbation based on my exam, however  he has had slightly increased use in his inhaler.  The Decadron that we are administering for his throat swelling should be does have any respiratory symptoms.  We will discharge with clindamycin to cover for remaining infection or inadequately treated strep. Additionally encourage close follow-up with his PCP for recheck, and return to the emergency department for worsening symptoms.  Patient and father agree to plan, discharged in stable condition at this time..   Final Clinical Impression(s) / ED Diagnoses Final diagnoses:  Pharyngitis, unspecified etiology    Rx / DC Orders ED Discharge Orders          Ordered    clindamycin (CLEOCIN) 150 MG capsule  3 times daily        10/04/21 2026              [place] Balirosperi, Kindel Rochefort H, PA-C 10/04/21 2042    Vanetta MuldersZackowski, Scott, MD 10/04/21 2256

## 2021-10-08 ENCOUNTER — Other Ambulatory Visit (HOSPITAL_COMMUNITY): Payer: Self-pay

## 2021-10-08 ENCOUNTER — Encounter: Payer: Commercial Managed Care - PPO | Admitting: Pediatrics

## 2021-10-08 ENCOUNTER — Telehealth: Payer: Self-pay

## 2021-10-08 NOTE — Telephone Encounter (Signed)
Patient Advocate Encounter ?  ?Received notification from [facility] that prior authorization for Pulmicort Flexhaler is required by his/her insurance [facility]. ?  ?PA submitted on 10/08/21 ? ?Key: [id number] ? ?Status is pending ?   ?[facility] will continue to follow: ? ?Patient Advocate ?Fax: [phone]  ?

## 2021-10-13 NOTE — Telephone Encounter (Signed)
Dr Judeth Horn please advise: ? ? ?Received a fax regarding Prior Authorization from [employer] [facility]) for PULMICORT. Authorization has been DENIED because PT HAS NOT TRIED TWO ALTERNATIVE MEDICATIONS ON THE MEMBERS FORMLARY.  ?  ?SYMBICORT, ASMANEX, FLOVENT, AND QVAR.  ?  ?ONLY SYMBICORT HAS BEEN TRIED.  ?

## 2021-10-13 NOTE — Telephone Encounter (Signed)
Received a fax regarding Prior Authorization from [employer] [facility]) for PULMICORT. Authorization has been DENIED because PT HAS NOT TRIED TWO ALTERNATIVE MEDICATIONS ON THE MEMBERS FORMLARY.  ? ?SYMBICORT, ASMANEX, FLOVENT, AND QVAR.  ? ?ONLY SYMBICORT HAS BEEN TRIED.  ?

## 2021-10-14 ENCOUNTER — Institutional Professional Consult (permissible substitution): Payer: Commercial Managed Care - PPO | Admitting: Pediatrics

## 2021-10-14 DIAGNOSIS — E871 Hypo-osmolality and hyponatremia: Secondary | ICD-10-CM | POA: Diagnosis not present

## 2021-10-14 DIAGNOSIS — D72829 Elevated white blood cell count, unspecified: Secondary | ICD-10-CM | POA: Diagnosis not present

## 2021-10-14 DIAGNOSIS — J020 Streptococcal pharyngitis: Secondary | ICD-10-CM | POA: Diagnosis not present

## 2021-10-23 DIAGNOSIS — E871 Hypo-osmolality and hyponatremia: Secondary | ICD-10-CM | POA: Diagnosis not present

## 2021-10-28 ENCOUNTER — Other Ambulatory Visit: Payer: Self-pay

## 2021-10-28 ENCOUNTER — Encounter: Payer: Self-pay | Admitting: Pediatrics

## 2021-10-28 ENCOUNTER — Ambulatory Visit (INDEPENDENT_AMBULATORY_CARE_PROVIDER_SITE_OTHER): Payer: BC Managed Care – PPO | Admitting: Pediatrics

## 2021-10-28 VITALS — BP 110/70 | HR 108 | Ht 72.0 in | Wt 181.0 lb

## 2021-10-28 DIAGNOSIS — Z79899 Other long term (current) drug therapy: Secondary | ICD-10-CM

## 2021-10-28 DIAGNOSIS — R278 Other lack of coordination: Secondary | ICD-10-CM

## 2021-10-28 DIAGNOSIS — Z719 Counseling, unspecified: Secondary | ICD-10-CM | POA: Diagnosis not present

## 2021-10-28 DIAGNOSIS — F902 Attention-deficit hyperactivity disorder, combined type: Secondary | ICD-10-CM | POA: Diagnosis not present

## 2021-10-28 MED ORDER — GUANFACINE HCL ER 3 MG PO TB24: ORAL_TABLET | ORAL | 0 refills | Status: DC

## 2021-10-28 MED ORDER — AZSTARYS 52.3-10.4 MG PO CAPS
1.0000 | ORAL_CAPSULE | Freq: Every morning | ORAL | 0 refills | Status: DC
Start: 2021-10-28 — End: 2022-01-12

## 2021-10-28 NOTE — Progress Notes (Signed)
Medication Check ? ?Patient ID: [name] ? ?DOB: [birth year]  ?MRN: [id number] ? ?DATE:10/28/21 ?Sueanne Margarita, DO ? ?Accompanied by: Self ?Patient Lives with: mother and father ?At [facility] - [address]. One roommate, one common bath with two stalls and two showers ? ?HISTORY/CURRENT STATUS: ?Chief Complaint - Polite and cooperative and present for medical follow up for medication management of ADHD, dysgraphia and  learning differences with anxiety. ?Last visit was 04/21/21 ? ?Currently prescribed Azstarys 52.3 mg every morning ?Intuniv 3 mg  every morning ? ? ?EDUCATION: ?School: [employer] Year/Grade: Freshman ?First semester - passed all classes ?Currently taking -  Sociology, [employer], music ?Dropped English - too much work due to illness ?Doing well enough, could be better ? ?Service plan: DSO ?Takes testing at office ?Used [employer] in the past ?Counseled you to discuss needs with DSO ? ?Activities/ Exercise: daily ?Goes to [employer] - daily ? ?Screen time: (phone, tablet, TV, computer): excessive ?Counseled to reduce non- essential screen time ? ?Driving: no concerns, not off campus much ? ?MEDICAL HISTORY: ?Appetite: WNL - improved once not ill   ?Sleep: Bedtime: School 2300-2400 Awakens: school mornings  0645- 0750 ?Concerns: Initiation/Maintenance/Other: Asleep easily, sleeps through the night, feels well-rested.  No Sleep concerns. ? ?Elimination: no concerns ? ?Individual Medical History/ Review of Systems: Changes? :Yes recent weight loss with illness - cough, sore throat, malaise ? ?Family Medical/ Social History: Changes? No ? ?MENTAL HEALTH: ?Denies sadness, loneliness or depression.  ?Denies self harm or thoughts of self harm or injury. ?Some fears, worries and anxieties. ?Has good peer relations and is not a bully nor is victimized. ? ? ?  10/28/2021  ?  2:25 PM  ?GAD 7 : Generalized Anxiety Score  ?Nervous, Anxious, on Edge 1  ?Control/stop worrying 1  ?Worry too much - different things 2  ?Trouble relaxing 0   ?Restless 0  ?Easily annoyed or irritable 1  ?Afraid - awful might happen 0  ?Total GAD 7 Score 5  ?Anxiety Difficulty Not difficult at all  ? ?  ? ?  10/28/2021  ?  2:26 PM  ?Depression screen PHQ 2/9  ?Decreased Interest 0  ?Down, Depressed, Hopeless 0  ?PHQ - 2 Score 0  ?Altered sleeping 0  ?Tired, decreased energy 1  ?Change in appetite 1  ?Feeling bad or failure about yourself  0  ?Trouble concentrating 2  ?Moving slowly or fidgety/restless 0  ?Suicidal thoughts 0  ?PHQ-9 Score 4  ?Difficult doing work/chores Not difficult at all  ?  ? ?PHYSICAL EXAM; ?Vitals:  ? 10/28/21 1406  ?BP: 110/70  ?Pulse: (!) 108  ?SpO2: 97%  ?Weight: 181 lb (82.1 kg)  ?Height: 6' (1.829 m)  ? ?Body mass index is 24.55 kg/m?. ? ?General Physical Exam: ?Unchanged from previous exam, date:04/21/21  ? ?Testing/Developmental Screens:  ?Adult ADHD Self Report Scale (most recent)   ? ? Adult ADHD Self-Report Scale (ASRS-v1.1) Symptom Checklist - 10/28/21 1433   ? ?  ? Part A  ? 1. How often do you have trouble wrapping up the final details of a project, once the challenging parts have been done? Rarely  2. How often do you have difficulty getting things done in order when you have to do a task that requires organization? Never   ? 3. How often do you have problems remembering appointments or obligations? Sometimes  4. When you have a task that requires a lot of thought, how often do you avoid or delay getting  started? Sometimes   ? 5. How often do you fidget or squirm with your hands or feet when you have to sit down for a long time? Sometimes  6. How often do you feel overly active and compelled to do things, like you were driven by a motor? Rarely   ?  ? Part B  ? 7. How often do you make careless mistakes when you have to work on a boring or difficult project? Rarely  8. How often do you have difficulty keeping your attention when you are doing boring or repetitive work? Never   ? 9. How often do you have difficulty concentrating on what  people say to you, even when they are speaking to you directly? Never  10. How often do you misplace or have difficulty finding things at home or at work? Rarely   ? 11. How often are you distracted by activity or noise around you? Rarely  12. How often do you leave your seat in meetings or other situations in which you are expected to remain seated? Rarely   ? 13. How often do you feel restless or fidgety? Never  14. How often do you have difficulty unwinding and relaxing when you have time to yourself? Never   ? 15. How often do you find yourself talking too much when you are in social situations? Rarely  16. When you are in a conversation, how often do you find yourself finishing the sentences of the people you are talking to, before they can finish them themselves? Never   ? 17. How often do you have difficulty waiting your turn in situations when turn taking is required? Never  18. How often do you interrupt others when they are busy? Never   ? ?  ?  ? ?  ?  ? ?  ?ASSESSMENT:  ?[name] is 89-years of age with a diagnosis of ADHD/dysgraphia with anxiety that is improved and well controlled with current medication.  No medication changes at this time.  We discussed accessing and using disability services in college. ?I discussed the need for continued screen time reduction and more physical activities.  Protein rich choices avoiding junk food and empty calories.  Maintaining good sleep and avoiding late nights. ?ADHD stable with medication management ?I spent 40 minutes on the date of service and the above activities to include counseling and education. ? ? ?DIAGNOSES:  ?  ICD-10-CM   ?1. ADHD (attention deficit hyperactivity disorder), combined type  F90.2   ?  ?2. Dysgraphia  R27.8   ?  ?3. Medication management  Z79.899   ?  ?4. Patient counseled  Z71.9   ?  ? ? ?RECOMMENDATIONS:  ?Patient Instructions  ?DISCUSSION: ?Counseled regarding the following coordination of care items: ? ?Continue medication as  directed ?Azstarys 52.3 mg every morning ?Intuniv 3 mg every morning ? ?RX for above e-scribed and sent to pharmacy on record ? ?[facility] [id number] - [place], [place] [address] [address] ?Phone: [phone] Fax: [phone] ? ?Advised importance of:  ?Sleep ?Maintain good sleep routines avoid late night ?Limited screen time (none on school nights, no more than 2 hours on weekends) ?Continue screen time reduction ?Regular exercise(outside and active play) ?Daily physical activities with skill building ?Healthy eating (drink water, no sodas/sweet tea) ?Protein rich avoiding junk and empty calories ? ? ?Additional resources for parents: ? ?[facility] -  https://childmind.org/ ?[employer] HolyTattoo.de  ? ? ? ? ? ?Patient verbalized understanding of all topics discussed. ? ?NEXT APPOINTMENT:  ?Return in about 6 months (around 04/30/2022) for Medication Check. ? ?Disclaimer: This documentation was generated through the use of dictation and/or voice recognition software, and as such, may contain spelling or other transcription errors. Please disregard any inconsequential errors.  Any questions regarding the content of this documentation should be directed to the individual who electronically signed. ? ?

## 2021-10-28 NOTE — Patient Instructions (Signed)
DISCUSSION: ?Counseled regarding the following coordination of care items: ? ?Continue medication as directed ?Azstarys 52.3 mg every morning ?Intuniv 3 mg every morning ? ?RX for above e-scribed and sent to pharmacy on record ? ?[facility][id number] - [place], [place] - [address] AT [facility] [address] ?[address] ?[place] [address] ?Phone: [phone] Fax: [phone] ? ?Advised importance of:  ?Sleep ?Maintain good sleep routines avoid late night ?Limited screen time (none on school nights, no more than 2 hours on weekends) ?Continue screen time reduction ?Regular exercise(outside and active play) ?Daily physical activities with skill building ?Healthy eating (drink water, no sodas/sweet tea) ?Protein rich avoiding junk and empty calories ? ? ?Additional resources for parents: ? ?[employer] [facility] - https://childmind.org/ ?ADDitude Magazine ThirdIncome.ca  ? ? ? ? ?

## 2021-11-20 DIAGNOSIS — R591 Generalized enlarged lymph nodes: Secondary | ICD-10-CM | POA: Diagnosis not present

## 2021-11-20 DIAGNOSIS — J020 Streptococcal pharyngitis: Secondary | ICD-10-CM | POA: Diagnosis not present

## 2021-11-20 DIAGNOSIS — J029 Acute pharyngitis, unspecified: Secondary | ICD-10-CM | POA: Diagnosis not present

## 2021-12-09 DIAGNOSIS — R591 Generalized enlarged lymph nodes: Secondary | ICD-10-CM | POA: Diagnosis not present

## 2022-01-12 ENCOUNTER — Telehealth: Payer: Self-pay | Admitting: Pediatrics

## 2022-01-12 MED ORDER — AZSTARYS 52.3-10.4 MG PO CAPS: 1.0000 | ORAL_CAPSULE | Freq: Every morning | ORAL | 0 refills | Status: DC

## 2022-01-12 NOTE — Telephone Encounter (Signed)
Mom called in for refill for Azstarys to be sent to [facility]

## 2022-01-12 NOTE — Telephone Encounter (Signed)
RX for above e-scribed and sent to pharmacy on record  [id number] - [place], [place] - [address] AT [address] [place] [address] Phone: [phone] Fax: [phone]   

## 2022-02-11 ENCOUNTER — Other Ambulatory Visit: Payer: Self-pay

## 2022-02-12 MED ORDER — AZSTARYS 52.3-10.4 MG PO CAPS: 1.0000 | ORAL_CAPSULE | Freq: Every morning | ORAL | 0 refills | Status: DC

## 2022-02-12 NOTE — Telephone Encounter (Signed)
RX for above e-scribed and sent to pharmacy on record  [id number] - [place], [place] - [address] AT [address] [place] [address] Phone: [phone] Fax: [phone]   

## 2022-03-23 ENCOUNTER — Other Ambulatory Visit: Payer: Self-pay

## 2022-03-23 MED ORDER — AZSTARYS 52.3-10.4 MG PO CAPS: 1.0000 | ORAL_CAPSULE | Freq: Every morning | ORAL | 0 refills | Status: DC

## 2022-03-23 MED ORDER — GUANFACINE HCL ER 3 MG PO TB24: ORAL_TABLET | ORAL | 1 refills | Status: DC

## 2022-03-23 NOTE — Telephone Encounter (Signed)
E-Prescribed Intuniv (guanfacine ER) 3 mg and Azstarys 52.3 directly to  [id number] - [place], [place] - [address] AT [facility] OF [address] [place] [address] Phone: [phone] Fax: [phone]

## 2022-04-22 ENCOUNTER — Other Ambulatory Visit: Payer: Self-pay

## 2022-04-22 MED ORDER — AZSTARYS 52.3-10.4 MG PO CAPS: 1.0000 | ORAL_CAPSULE | Freq: Every morning | ORAL | 0 refills | Status: DC

## 2022-04-22 NOTE — Telephone Encounter (Signed)
RX for above e-scribed and sent to pharmacy on record  [id number] - [place], [place] - [address] AT [address] [place] [address] Phone: [phone] Fax: [phone]   

## 2022-04-28 ENCOUNTER — Telehealth (INDEPENDENT_AMBULATORY_CARE_PROVIDER_SITE_OTHER): Payer: BC Managed Care – PPO | Admitting: Pediatrics

## 2022-04-28 ENCOUNTER — Encounter: Payer: Self-pay | Admitting: Pediatrics

## 2022-04-28 DIAGNOSIS — Z79899 Other long term (current) drug therapy: Secondary | ICD-10-CM

## 2022-04-28 DIAGNOSIS — Z719 Counseling, unspecified: Secondary | ICD-10-CM

## 2022-04-28 DIAGNOSIS — F902 Attention-deficit hyperactivity disorder, combined type: Secondary | ICD-10-CM | POA: Diagnosis not present

## 2022-04-28 NOTE — Progress Notes (Signed)
[facility]. [address] [place] [address] Dept: [phone] Dept Fax: [phone]  Medication Check by Caregility due to COVID-19  Patient ID:  [name]  male DOB: 2000-10-01   21 y.o.   MRN: [id number]   DATE:04/28/22  Interviewed: Cala Bradford  Location: Dorm room at [facility] -no others present Provider location: [facility] office  Virtual Visit via Video Note Connected with Cala Bradford on 04/28/22 at  9:00 AM EDT by video enabled telemedicine application and verified that I am speaking with the correct person using two identifiers.     I discussed the limitations, risks, security and privacy concerns of performing an evaluation and management service by telephone and the availability of in person appointments. I also discussed with the parent/patient that there may be a patient responsible charge related to this service. The parent/patient expressed understanding and agreed to proceed.  HISTORY OF PRESENT ILLNESS/CURRENT STATUS: [name] is being followed for medication management for ADHD. Last visit on 10/28/2021 in person  Deivi currently prescribed azstarys 52.3 mg and Intuniv 3 mg every morning  Behaviors: Doing well at school and in social relationships  EDUCATION: School: [employer] Year/Grade: Sophomore Freshman year - "not terrible".  Passed all classes.  Took two summer - gen ed, and possibly [employer] as well Currently this semester - health care management, principles of macro, environmental science Dropped math due to challenged class, taking up too much time   Has tutoring daily Counseled regarding continued use of disability services/counseling tutoring  Activities/ Exercise: daily Goes to the gym, up to three times per week No clubs or fraternities Counseled daily physical activity skill building as well as social connections groups clubs activities  Screen  time: (phone, tablet, TV, computer): non-essential, not excessive Counseled continued screen time reduction MEDICAL HISTORY: Individual Medical History/ Review of Systems: Changes? :No  Family Medical/ Social History: Changes? No   Patient Lives with: Thedore Mins - roommate, on campus. Renting a small house, has two bedrooms, should have 3 guys, but its just the two guys  MENTAL HEALTH: No concerns discussed  ASSESSMENT:  [name] is a 39-years of age with a diagnosis of ADHD that is improved and well controlled current medication.  No medication changes at this time. Counseling and education provided to the patient as indicated in the note above during this visit. Overall the ADHD stable with medication management Has Appropriate school accommodations with progress academically  DIAGNOSES:    ICD-10-CM   1. ADHD (attention deficit hyperactivity disorder), combined type  F90.2     2. Medication management  Z79.899     3. Patient counseled  Z71.9        RECOMMENDATIONS:  Patient Instructions  DISCUSSION: Counseled regarding the following coordination of care items:  Continue medication as directed Azstarys 52.3 mg every morning Intuniv 3 mg every morning   No RX today, recently submitted.  Advised importance of:  Sleep Maintain good sleep routines.  Limited screen time (none on school nights, no more than 2 hours on weekends) Decrease all screen time  Regular exercise(outside and active play) Daily physical activities  Healthy eating (drink water, no sodas/sweet tea) Protein rich, avoid junk and empty calories   Additional resources for parents:  [place] - https://childmind.org/ ADDitude Magazine [email or web address].de        NEXT APPOINTMENT:  Return in about 6 months (around 10/27/2022) for Medication Check. Please call  the office for a sooner appointment if problems arise.  Medical Decision-making:  I spent 12 minutes dedicated to the  care of this patient on the date of this encounter to include face to face time with the patient and/or parent reviewing medical records and documentation by teachers, performing and discussing the assessment and treatment plan, reviewing and explaining completed speciality labs and obtaining specialty lab samples.  The patient and/or parent was provided an opportunity to ask questions and all were answered. The patient and/or parent agreed with the plan and demonstrated an understanding of the instructions.   The patient and/or parent was advised to call back or seek an in-person evaluation if the symptoms worsen or if the condition fails to improve as anticipated.  I provided 12 minutes of video-face-to-face time during this encounter.   Completed record review for 5 minutes prior to and after the virtual visit.   Disclaimer: This documentation was generated through the use of dictation and/or voice recognition software, and as such, may contain spelling or other transcription errors. Please disregard any inconsequential errors.  Any questions regarding the content of this documentation should be directed to the individual who electronically signed.

## 2022-04-28 NOTE — Patient Instructions (Signed)
DISCUSSION: Counseled regarding the following coordination of care items:  Continue medication as directed Azstarys 52.3 mg every morning Intuniv 3 mg every morning   No RX today, recently submitted.  Advised importance of:  Sleep Maintain good sleep routines.  Limited screen time (none on school nights, no more than 2 hours on weekends) Decrease all screen time  Regular exercise(outside and active play) Daily physical activities  Healthy eating (drink water, no sodas/sweet tea) Protein rich, avoid junk and empty calories   Additional resources for parents:  [place] - https://[email or web address]/ ADDitude Magazine [email or web address]

## 2022-05-19 ENCOUNTER — Telehealth: Payer: Self-pay | Admitting: Pediatrics

## 2022-05-19 NOTE — Telephone Encounter (Signed)
Mother had emailed requesting a telephone conversation regarding medical exemption withdrawal from [facility] for [place].  Will provide letter and support to family.

## 2022-06-03 ENCOUNTER — Telehealth: Payer: Self-pay | Admitting: Pediatrics

## 2022-06-16 ENCOUNTER — Other Ambulatory Visit: Payer: Self-pay

## 2022-06-16 MED ORDER — AZSTARYS 52.3-10.4 MG PO CAPS: 1.0000 | ORAL_CAPSULE | Freq: Every morning | ORAL | 0 refills | Status: DC

## 2022-06-16 NOTE — Telephone Encounter (Signed)
RX for above e-scribed and sent to pharmacy on record  [id number] - [place], [place] - [address] [place]  [address] Phone: [phone] Fax: [phone]   

## 2022-06-24 ENCOUNTER — Ambulatory Visit (HOSPITAL_BASED_OUTPATIENT_CLINIC_OR_DEPARTMENT_OTHER): Payer: BC Managed Care – PPO | Admitting: Psychologist

## 2022-06-24 ENCOUNTER — Encounter (HOSPITAL_COMMUNITY): Payer: Self-pay | Admitting: Psychologist

## 2022-06-24 DIAGNOSIS — F988 Other specified behavioral and emotional disorders with onset usually occurring in childhood and adolescence: Secondary | ICD-10-CM

## 2022-06-24 DIAGNOSIS — F4320 Adjustment disorder, unspecified: Secondary | ICD-10-CM

## 2022-06-24 DIAGNOSIS — F810 Specific reading disorder: Secondary | ICD-10-CM

## 2022-06-24 NOTE — Progress Notes (Signed)
  [facility] [address] [place] [address] Dept: [phone] Dept Fax: [phone]  Psychology Therapy Session Progress Note  Patient ID: [name], male  DOB: 10-Mar-2001, 21 y.o.  MRN: [id number]  06/24/2022 Start time: 9 AM End time: 9:50 AM  Session #: In office psychotherapy session  Method of Visit: Face-to-Face  Present: father and patient  Service provided: 90834P Individual Psychotherapy (45 min.)  Current Concerns: ADHD, reading disorder, dysgraphia, memory deficits.  Recently withdrew from all of his college classes because of academic struggles and his inability to access appropriate accommodations.  Living at home.  Mood irritable.  Disappointed at himself.  Current Symptoms: Academic problems, Depressed Mood, and Irritability  Mental Status: Appearance: Well Groomed Attention: good  Motor Behavior: Normal Affect: Appropriate Mood: Mildly dysthymic and irritable Thought Process: normal Thought Content: normal Suicidal Ideation: None Homicidal Ideation:None Orientation: time, place, and person Insight: Fair Judgement: Fair  Diagnosis: Adjustment disorder NOS, ADHD, reading disorder, dysgraphia  Long Term Treatment Goals: Long-term goals for depression:  1) improved mood 2) increase energy level 3) increase socialization 4) decrease anhedonia 5) utilized cognitive behavioral therapy principles  1) decrease impulsivity 2) increase self-monitoring 3) increase organizational skills 4) increase time management skills 5) increased behavioral regulation 6) increase self-monitoring 7) utilized cognitive behavioral principles  Enroll in [employer] for the spring semester.  Apply for [employer] for the fall semester.  Pursue a part-time job.  Anticipated Frequency of Visits: Every other week Anticipated Length of Treatment Episode: 2 to 3 months  Treatment Intervention:  Cognitive Behavioral therapy and Supportive therapy  Response to Treatment: Neutral  Medical Necessity: Improved patient condition  Plan: CBT, pursue part-time job, attend either [employer] or [employer] for spring semester  Solara Goodchild. Jolene Provost, PhD 06/24/2022

## 2022-07-09 ENCOUNTER — Encounter (HOSPITAL_COMMUNITY): Payer: Self-pay | Admitting: Psychologist

## 2022-07-09 ENCOUNTER — Ambulatory Visit (HOSPITAL_BASED_OUTPATIENT_CLINIC_OR_DEPARTMENT_OTHER): Payer: BC Managed Care – PPO | Admitting: Psychologist

## 2022-07-09 DIAGNOSIS — F988 Other specified behavioral and emotional disorders with onset usually occurring in childhood and adolescence: Secondary | ICD-10-CM

## 2022-07-09 DIAGNOSIS — F4321 Adjustment disorder with depressed mood: Secondary | ICD-10-CM | POA: Diagnosis not present

## 2022-07-09 DIAGNOSIS — F810 Specific reading disorder: Secondary | ICD-10-CM

## 2022-07-09 NOTE — Progress Notes (Signed)
  [facility] [address] [place] [address] Dept: [phone] Dept Fax: [phone]  Psychology Therapy Session Progress Note  Patient ID: [name], male  DOB: Dec 01, 2000, 21 y.o.  MRN: [id number]  07/09/2022 Start time: 9 AM End time: 9:50 AM  Session #: In office psychotherapy session  Method of Visit: Face-to-Face  Present: father and patient  Service provided: 90834P Individual Psychotherapy (45 min.)  Current Concerns: Mild dysphoria secondary to academic difficulties necessitating withdrawing from [employer] for the semester and moving back home.  ADHD with weak and inconsistent executive functioning particularly in the areas of metacognition.  Significant learning disorder.  Irritability and anger management issues.  Disturbed sleep-wake cycle.  Current Symptoms: Academic problems, Anger, Depressed Mood, and Irritability  Mental Status: Appearance: Well Groomed Attention: good  Motor Behavior: Normal Affect: Full Range Mood: Mildly dysphoric Thought Process: normal Thought Content: normal Suicidal Ideation: None Homicidal Ideation:None Orientation: time, place, and person Insight: Fair Judgement: Fair  Diagnosis:  Adjustment disorder with mild depressed mood, ADHD, reading disorder Long Term Treatment Goals:   Long-term goals for depression:  1) improved mood 2) increase energy level 3) increase socialization 4) decrease anhedonia 5) utilized cognitive behavioral therapy principles Goals for ADHD: 1) decrease impulsivity 2) increase self-monitoring 3) increase organizational skills 4) increase time management skills 5) increased behavioral regulation 6) increase self-monitoring 7) utilized cognitive behavioral principles Goals for anger: 1) decrease anger 2) identify anger triggers 3) confront anger inducing thoughts 4) use coping strategies:  (deep breathing,  diversion, freeze frame, visualization, muscle relaxation)   Anticipated Frequency of Visits: Every 2 to 3 weeks Anticipated Length of Treatment Episode: 3 months  Treatment Intervention: Cognitive Behavioral therapy and Psychoeducation  Response to Treatment: Neutral  Medical Necessity: Improved patient condition  Plan: CBT, follow-through on behavioral activation plan, follow through on anger management plan  Beatrix Fetters, PhD 07/09/2022

## 2022-07-14 ENCOUNTER — Other Ambulatory Visit: Payer: Self-pay

## 2022-07-14 MED ORDER — AZSTARYS 52.3-10.4 MG PO CAPS: 1.0000 | ORAL_CAPSULE | Freq: Every morning | ORAL | 0 refills | Status: DC

## 2022-07-14 NOTE — Telephone Encounter (Signed)
RX for above e-scribed and sent to pharmacy on record  [id number] - [place], [place] - [address] [place] [address] Phone: [phone] Fax: [phone]   

## 2022-07-15 ENCOUNTER — Other Ambulatory Visit: Payer: Self-pay | Admitting: Pediatrics

## 2022-07-15 MED ORDER — AZSTARYS 52.3-10.4 MG PO CAPS: 1.0000 | ORAL_CAPSULE | Freq: Every morning | ORAL | 0 refills | Status: DC

## 2022-07-15 NOTE — Telephone Encounter (Signed)
RX for above e-scribed and sent to pharmacy on record  [id number] - [place], [place] - [address] [place] [address] Phone: [phone] Fax: [phone]   

## 2022-08-06 ENCOUNTER — Ambulatory Visit (HOSPITAL_BASED_OUTPATIENT_CLINIC_OR_DEPARTMENT_OTHER): Payer: 59 | Admitting: Psychologist

## 2022-08-06 ENCOUNTER — Encounter (HOSPITAL_COMMUNITY): Payer: Self-pay | Admitting: Psychologist

## 2022-08-06 DIAGNOSIS — F988 Other specified behavioral and emotional disorders with onset usually occurring in childhood and adolescence: Secondary | ICD-10-CM | POA: Diagnosis not present

## 2022-08-06 DIAGNOSIS — F4321 Adjustment disorder with depressed mood: Secondary | ICD-10-CM | POA: Diagnosis not present

## 2022-08-06 DIAGNOSIS — F810 Specific reading disorder: Secondary | ICD-10-CM

## 2022-08-06 NOTE — Progress Notes (Signed)
  [facility] [place] [address] Dept: [phone] Dept Fax: [phone]  Psychology Therapy Session Progress Note  Patient ID: [name], male  DOB: 04/28/2001, 22 y.o.  MRN: [id number]  08/06/2022 Start time: 9 AM End time: 9:50 AM  Session #: In office psychotherapy session  Method of Visit: Face-to-Face  Present: father, patient  Service provided: 90834P Individual Psychotherapy (45 min.)  Current Concerns: Mild anxiety and dysphoria significantly improved.  Anger and irritability improving as well.  ADHD with comorbid weak and inconsistent executive functioning, particularly in the area of metacognition.  Reading disorder.  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  Current Symptoms: Academic problems, Anxiety, Attention problem, Depressed Mood, and Irritability  Mental Status: Appearance: Well Groomed Attention: fair  Motor Behavior: Normal Affect: Full Range Mood: anxious Thought Process: normal Thought Content: normal Suicidal Ideation: None Homicidal Ideation:None Orientation: time, place, and person Insight: Fair Judgement: Fair  Diagnosis: Adjustment disorder with mild anxiety and depressed mood, ADHD, reading disorder/dyslexia  Long Term Treatment Goals:  1) decrease anxiety 2) resist flight/freeze response 3) identify anxiety inducing thoughts 4) use relaxation strategies (deep breathing, visualization, cognitive cueing, muscle relaxation)  Long-term goals for depression:  1) improved mood 2) increase energy level 3) increase socialization 4) decrease anhedonia 5) utilized cognitive behavioral therapy principles  1) decrease anger 2) identify anger triggers 3) confront anger inducing thoughts 4) use coping strategies:  (deep breathing, diversion, freeze frame, visualization, muscle relaxation)  1) decrease impulsivity 2) increase  self-monitoring 3) increase organizational skills 4) increase time management skills 5) increased behavioral regulation 6) increase self-monitoring 7) utilized cognitive behavioral principles   Anticipated Frequency of Visits: As needed Anticipated Length of Treatment Episode: 3 months  Treatment Intervention: Cognitive Behavioral therapy and Psychoeducation  Response to Treatment: Positive as evidenced by patient and parent report of improved sleep wake cycle, improved mood, and improved productivity.  Medical Necessity: Improved patient condition  Plan: CBT, [facility], gave copy of study strategies/time management strategies/test taking strategies handout and discussed same.  Clovis Pu, PhD 08/06/2022

## 2022-08-19 ENCOUNTER — Telehealth: Payer: Self-pay | Admitting: Pediatrics

## 2022-08-25 ENCOUNTER — Encounter: Payer: Self-pay | Admitting: Pediatrics

## 2022-08-26 ENCOUNTER — Other Ambulatory Visit: Payer: Self-pay

## 2022-08-26 MED ORDER — AZSTARYS 52.3-10.4 MG PO CAPS: 1.0000 | ORAL_CAPSULE | Freq: Every morning | ORAL | 0 refills | Status: DC

## 2022-08-26 MED ORDER — GUANFACINE HCL ER 3 MG PO TB24: ORAL_TABLET | ORAL | 1 refills | Status: AC

## 2022-08-26 NOTE — Telephone Encounter (Signed)
RX for above e-scribed and sent to pharmacy on record  [facility], [place] [address] Phone: [phone] Fax: [phone]

## 2022-08-27 ENCOUNTER — Ambulatory Visit (HOSPITAL_COMMUNITY): Payer: BC Managed Care – PPO | Admitting: Psychologist

## 2022-09-01 ENCOUNTER — Other Ambulatory Visit: Payer: Self-pay | Admitting: Pediatrics

## 2022-09-02 MED ORDER — AZSTARYS 52.3-10.4 MG PO CAPS: 1.0000 | ORAL_CAPSULE | Freq: Every morning | ORAL | 0 refills | Status: AC

## 2022-09-02 NOTE — Telephone Encounter (Signed)
RX for above e-scribed and sent to pharmacy on record  [facility], [place] [address] Phone: [phone] Fax: [phone]

## 2022-09-04 ENCOUNTER — Telehealth: Payer: Self-pay

## 2022-09-04 NOTE — Telephone Encounter (Signed)
Outcome Approved today Request Reference Number: [id number]. [employer]STARYS CAP 52.3-10. is approved through 09/05/2023. Your patient may now fill this prescription and it will be covered. Authorization Expiration Date: 09/05/2023

## 2022-09-17 ENCOUNTER — Encounter (HOSPITAL_COMMUNITY): Payer: Self-pay | Admitting: Psychologist

## 2022-09-17 ENCOUNTER — Ambulatory Visit (HOSPITAL_COMMUNITY): Payer: 59 | Admitting: Psychologist

## 2022-09-17 DIAGNOSIS — F988 Other specified behavioral and emotional disorders with onset usually occurring in childhood and adolescence: Secondary | ICD-10-CM | POA: Diagnosis not present

## 2022-09-17 DIAGNOSIS — F810 Specific reading disorder: Secondary | ICD-10-CM

## 2022-09-17 DIAGNOSIS — F4321 Adjustment disorder with depressed mood: Secondary | ICD-10-CM | POA: Diagnosis not present

## 2022-09-17 NOTE — Progress Notes (Signed)
  [facility] [place] [address] Dept: [phone] Dept Fax: [phone]  Psychology Therapy Session Progress Note  Patient ID: [name], male  DOB: 11/17/2000, 22 y.o.  MRN: [id number]   09/17/2022 Start time: 8:05 AM End time: 8:55 AM  Session #: In office psychotherapy session  Method of Visit: Face-to-Face  Present: father and patient  Service provided: 90834P Individual Psychotherapy (45 min.)  Current Concerns: Mild anxiety, dysphoria, and irritability all improved.  ADHD with weak and inconsistent executive functioning improving with coaching and tutoring.  Dyslexia and severe working memory deficits.  Current Symptoms: Anxiety, Irritability, and Organization problem  Mental Status: Appearance: Well Groomed Attention: good  Motor Behavior: Normal Affect: Full Range Mood: normal Thought Process: normal Thought Content: normal Suicidal Ideation: None Homicidal Ideation:None Orientation: time, place, and person Insight: Fair Judgement: Fair  Diagnosis: Adjustment disorder with mild dysphoria, anxiety and irritability.  ADHD, dyslexia  Long Term Treatment Goals: Goals for irritability/anger 1) decrease anger 2) identify anger triggers 3) confront anger inducing thoughts 4) use coping strategies:  (deep breathing, diversion, freeze frame, visualization, muscle relaxation)  Goals for anxiety: 1) decrease anxiety 2) resist flight/freeze response 3) identify anxiety inducing thoughts 4) use relaxation strategies (deep breathing, visualization, cognitive cueing, muscle relaxation)   Long-term goals for depression: 1) improved mood 2) increase energy level 3) increase socialization 4) decrease anhedonia 5) utilized cognitive behavioral therapy principles  Goals for ADHD 1) decrease impulsivity 2) increase self-monitoring 3) increase organizational  skills 4) increase time management skills 5) increased behavioral regulation 6) increase self-monitoring 7) utilized cognitive behavioral principles   Anticipated Frequency of Visits: Every 2 -3 weeks Anticipated Length of Treatment Episode: 3 months  Treatment Intervention: Cognitive Behavioral therapy and Psychoeducation  Response to Treatment: Positive as evidenced by patient and parent report of good transition to [employer] with good grades.  Also, as evidenced by patient report of improved mood and productivity.  Medical Necessity: Improved patient condition  Plan: CBT, continue med management with MD, continue academic tutoring.  Clovis Pu, PhD 09/17/2022

## 2022-09-21 ENCOUNTER — Telehealth (INDEPENDENT_AMBULATORY_CARE_PROVIDER_SITE_OTHER): Payer: Self-pay

## 2022-09-21 NOTE — Telephone Encounter (Signed)
Last seen 09/17/22 by a Dr. Eloise Harman with [facility]-    Guanfacine last ordered by Janifer Adie NP on 08/26/2012 90 day supply with 1 refill.   Fax from pharm requesting refill but this should not be needed. Message left for pharm.about 1/24 rx and should not need a refill for about 3 more months.

## 2022-09-25 ENCOUNTER — Encounter: Payer: BC Managed Care – PPO | Admitting: Pediatrics

## 2022-10-01 ENCOUNTER — Ambulatory Visit (HOSPITAL_BASED_OUTPATIENT_CLINIC_OR_DEPARTMENT_OTHER): Payer: 59 | Admitting: Psychologist

## 2022-10-01 ENCOUNTER — Encounter (HOSPITAL_COMMUNITY): Payer: Self-pay | Admitting: Psychologist

## 2022-10-01 DIAGNOSIS — F810 Specific reading disorder: Secondary | ICD-10-CM | POA: Diagnosis not present

## 2022-10-01 DIAGNOSIS — F988 Other specified behavioral and emotional disorders with onset usually occurring in childhood and adolescence: Secondary | ICD-10-CM

## 2022-10-01 NOTE — Progress Notes (Signed)
  [facility] [place] [address] Dept: [phone] Dept Fax: [phone]  Psychology Therapy Session Progress Note  Patient ID: [name], male  DOB: [birth year], 22 y.o.  MRN: [id number]  10/01/2022 Start time: 8:05 AM End time: 8:55 AM  Session #: In office psychotherapy session  Method of Visit: Face-to-Face  Present: father and patient  Service provided: 90834P Individual Psychotherapy (45 min.)  Current Concerns: Mild depression and anxiety significantly improved.  ADHD with weak and inconsistent executive functioning, particularly in the area of metacognition also improving with academic coaching and tutoring.  Reading disorder  Current Symptoms: Academic problems, Attention problem, Irritability, and Organization problem  Mental Status: Appearance: Well Groomed Attention: good  Motor Behavior: Normal Affect: Full Range Mood: normal Thought Process: normal Thought Content: normal Suicidal Ideation: None Homicidal Ideation:None Orientation: time, place, and person Insight: Fair Judgement: Fair  Diagnosis: Adjustment disorder with anxiety and depression significantly improved.  ADHD, reading disorder  Long Term Treatment Goals:  For ADHD: 1) decrease impulsivity 2) increase self-monitoring 3) increase organizational skills 4) increase time management skills 5) increased behavioral regulation 6) increase self-monitoring 7) utilized cognitive behavioral principles  Regarding anxiety: 1) decrease anxiety 2) resist flight/freeze response 3) identify anxiety inducing thoughts 4) use relaxation strategies (deep breathing, visualization, cognitive cueing, muscle relaxation)  Long-term goals for depression:  1) improved mood 2) increase energy level 3) increase socialization 4) decrease anhedonia 5) utilized cognitive behavioral therapy  principles    Anticipated Frequency of Visits: Every 2 to 3 weeks Anticipated Length of Treatment Episode: 3 months  Treatment Intervention: Cognitive Behavioral therapy and Psychoeducation  Response to Treatment: Positive as evidenced by patient and parent report of improved mood, decreased anger/irritability, positive academic performance (A/B's for midterm grades).  Medical Necessity: Improved patient condition  Plan: CBT, continue academic coaching/tutoring, continue medication consultation with PCP, complete college transfer applications  Seth Friedlander. Eloise Harman, PhD 10/01/2022

## 2022-10-22 ENCOUNTER — Ambulatory Visit (HOSPITAL_BASED_OUTPATIENT_CLINIC_OR_DEPARTMENT_OTHER): Payer: 59 | Admitting: Psychologist

## 2022-10-22 DIAGNOSIS — F988 Other specified behavioral and emotional disorders with onset usually occurring in childhood and adolescence: Secondary | ICD-10-CM

## 2022-10-22 DIAGNOSIS — F4321 Adjustment disorder with depressed mood: Secondary | ICD-10-CM | POA: Diagnosis not present

## 2022-10-22 DIAGNOSIS — F810 Specific reading disorder: Secondary | ICD-10-CM

## 2022-10-22 NOTE — Progress Notes (Signed)
  [facility] [place] [address] Dept: [phone] Dept Fax: [phone]  Psychology Therapy Session Progress Note  Patient ID: [name], male  DOB: 07/22/2001, 22 y.o.  MRN: [id number]  10/22/2022 Start time: 8:05 AM End time: 8:55 AM  Session #: In office psychotherapy session  Method of Visit: Face-to-Face  Present: father and patient  Service provided: 90834P Individual Psychotherapy (45 min.)  Current Concerns: Mild dysphoria secondary to relationship issues.  ADHD with weak and inconsistent executive functioning particularly in the area of metacognition improving with academic coaching and tutoring.  Chronic irritability has lessened.  Reading disorder.  Current Symptoms: Attention problem, Depressed Mood, and Organization problem  Mental Status: Appearance: Well Groomed Attention: good  Motor Behavior: Normal Affect: Full Range Mood: dysthymic Thought Process: normal Thought Content: normal Suicidal Ideation: None Homicidal Ideation:None Orientation: time, place, and person Insight: Fair Judgement: Good  Diagnosis: Adjustment disorder with mild dysthymia, ADHD, reading disorder  Long Term Treatment Goals:  Long-term goals for depression:  1) improved mood 2) increase energy level 3) increase socialization 4) decrease anhedonia 5) utilized cognitive behavioral therapy principles  Goals for ADHD:  1) decrease impulsivity 2) increase self-monitoring 3) increase organizational skills 4) increase time management skills 5) increased behavioral regulation 6) increase self-monitoring 7) utilized cognitive behavioral principles    Anticipated Frequency of Visits: Every 2 to 3 weeks Anticipated Length of Treatment Episode: 4 months  Treatment Intervention: Cognitive Behavioral therapy and Supportive therapy  Response to Treatment: Positive as evidenced  by patient and parent report of continued positive academic performance.  Medical Necessity: Improved patient condition  Plan: CBT, discussed transition to PGY 3 resident in July, continue medication consultation/management with PCP  Jadon Ressler. Eloise Harman, PhD 10/22/2022

## 2022-10-24 IMAGING — DX DG CHEST 2V
2 series · 2 of 2 positions shown · non-contrast
Comparison: None.

CLINICAL DATA: Shortness of breath

EXAM:
CHEST - 2 VIEW

[chest pa]
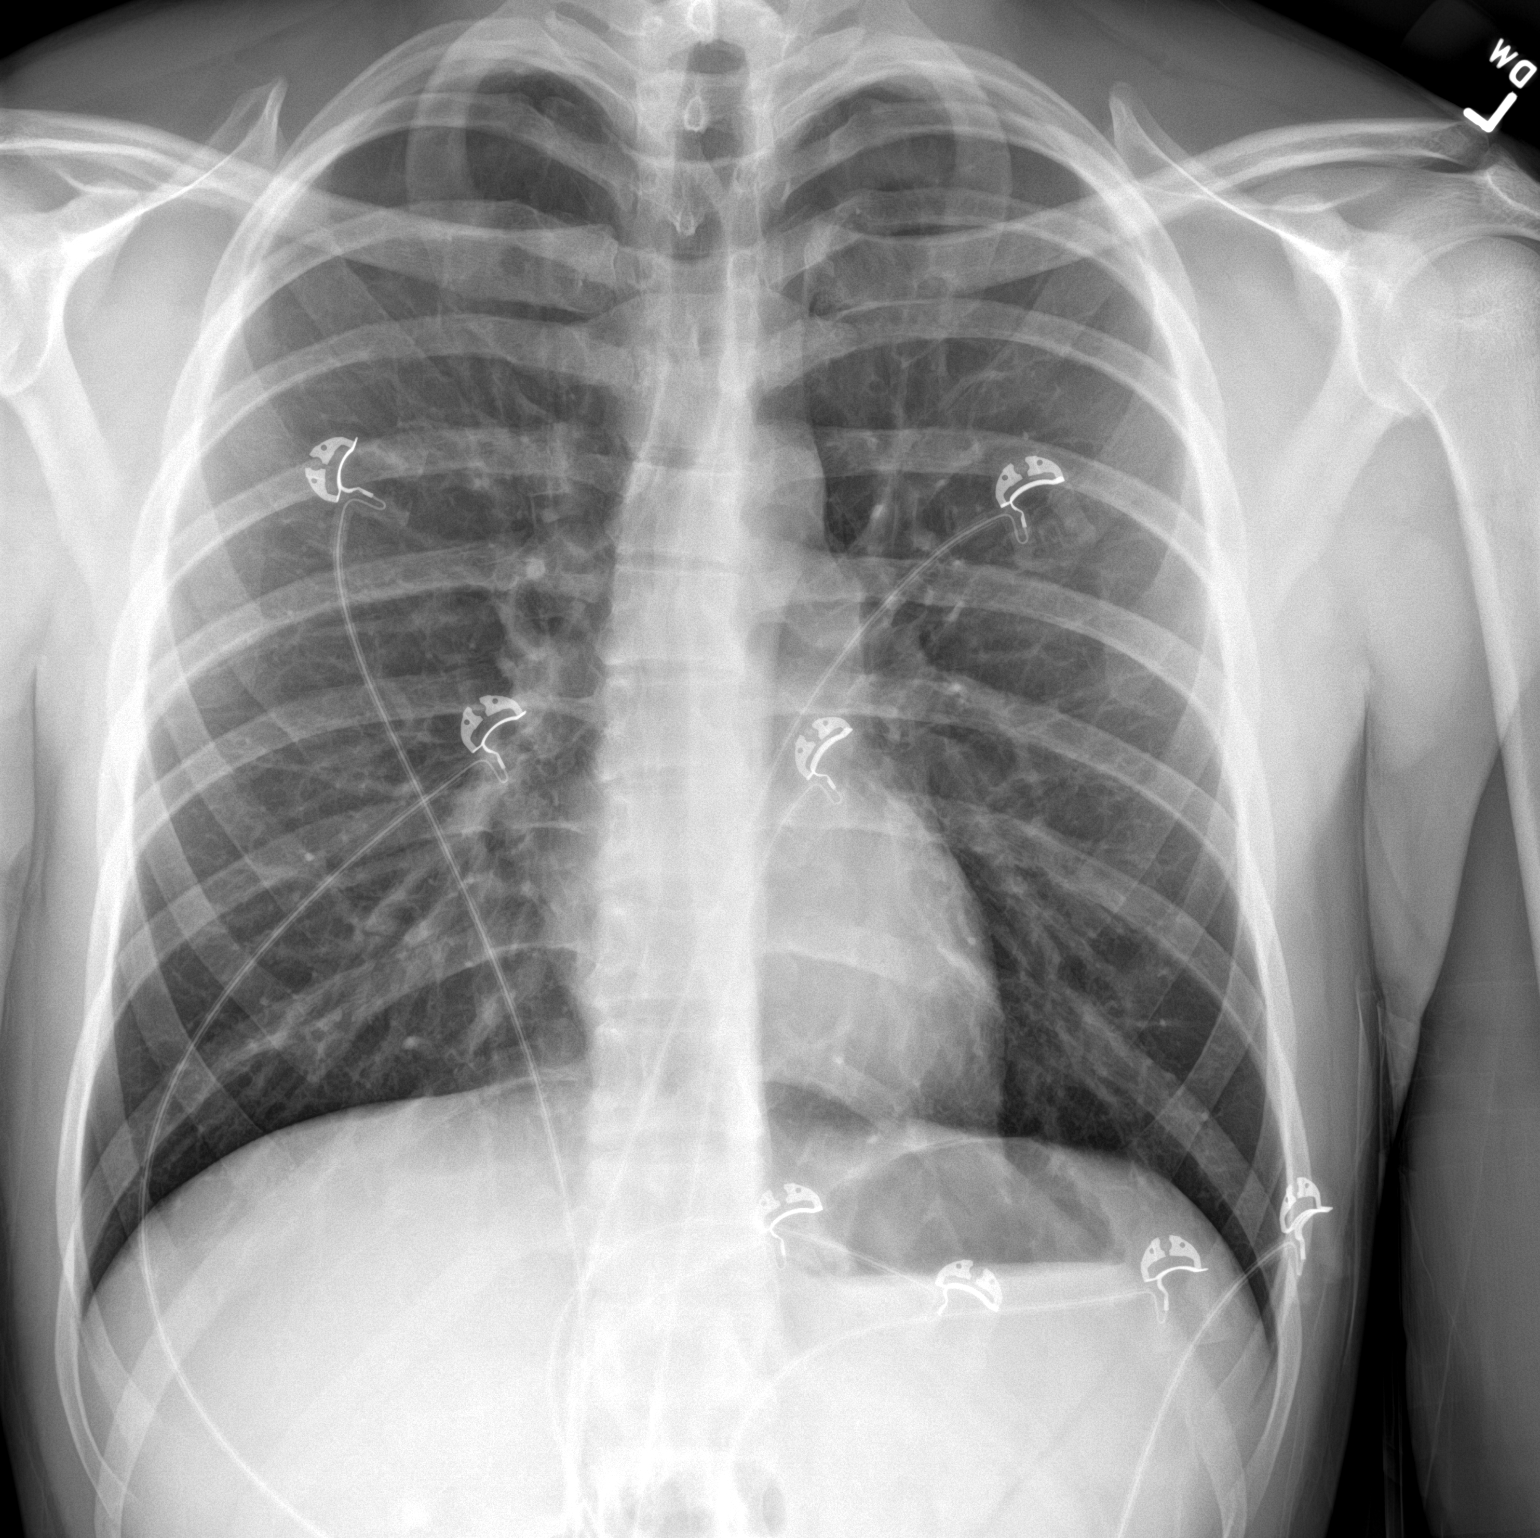

[chest lat]
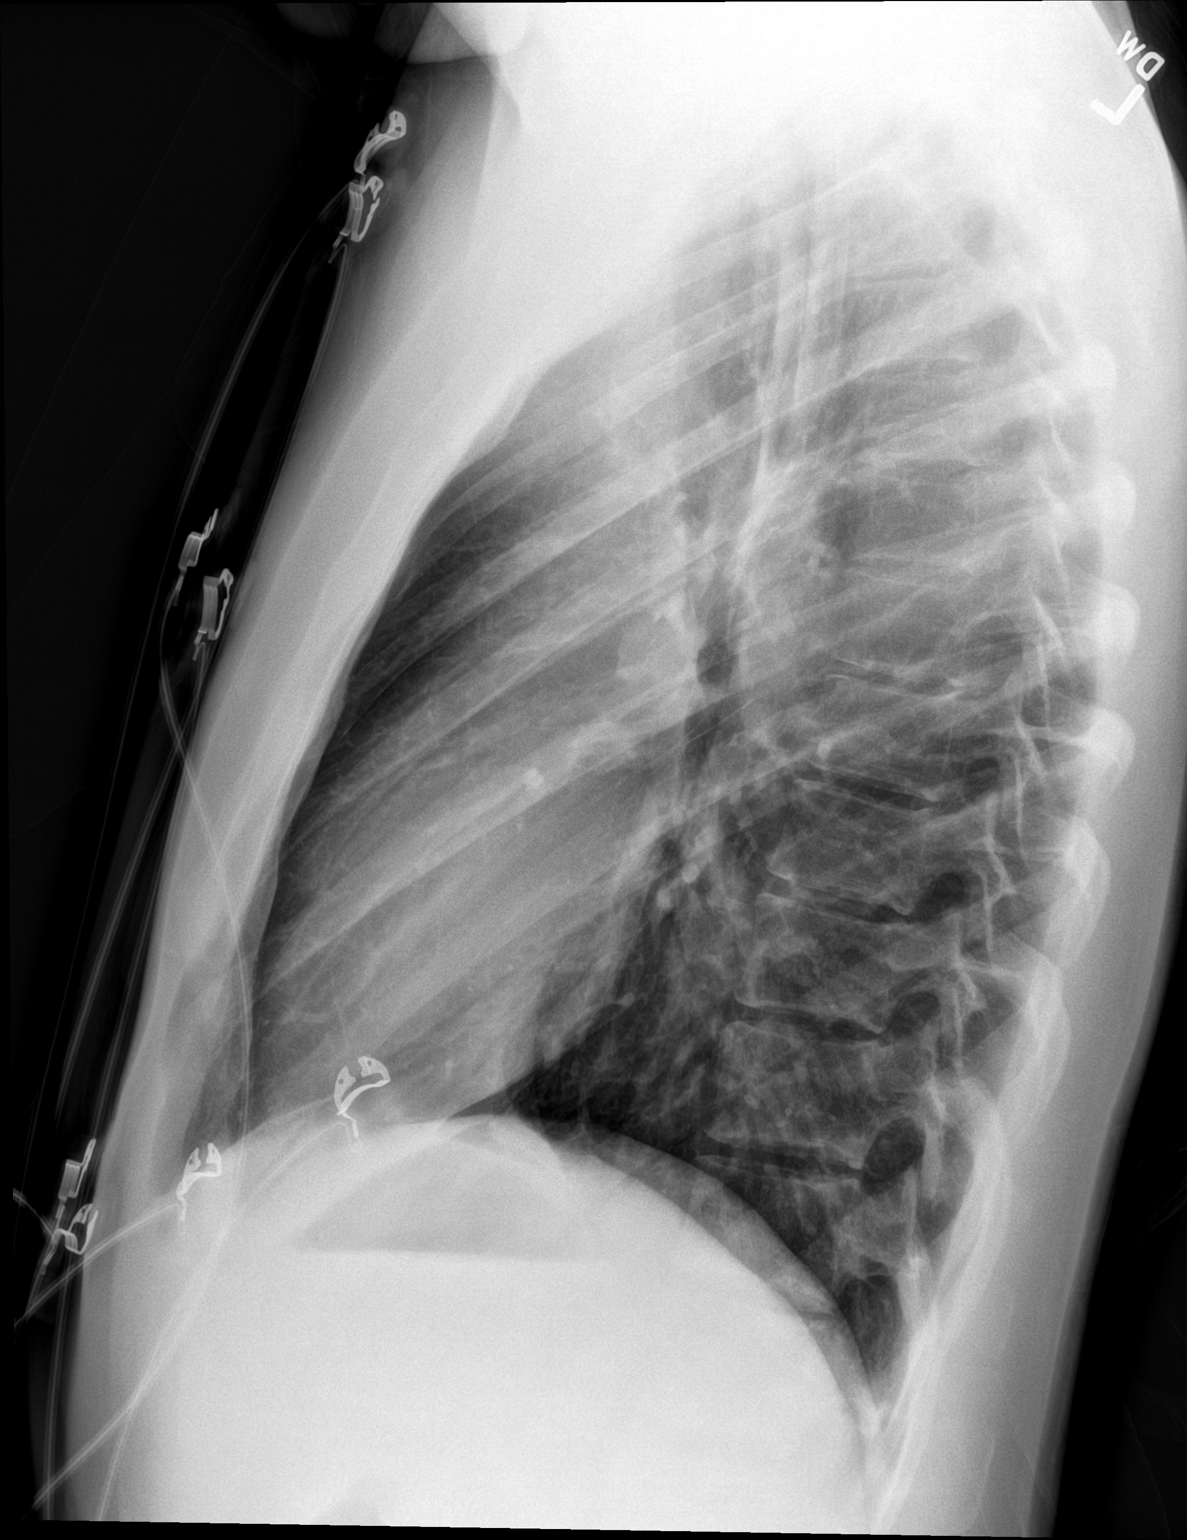

[2 of 2 positions shown; findings below may reference images not displayed]

FINDINGS: The heart size and mediastinal contours are within normal limits.
Both lungs are clear. The visualized skeletal structures are
unremarkable.
IMPRESSION: No acute abnormality of the lungs.

## 2022-10-24 IMAGING — CT CT NECK W/ CM
3 of 5 series · 11 of 33 positions shown, 13 images · IV contrast (APPLIED)
Comparison: None.

CLINICAL DATA: Epiglottitis or tonsillitis suspected. Additional
history provided: Fever. 15 pound weight loss in 3 weeks.

EXAM:
CT NECK WITH CONTRAST
TECHNIQUE: Multidetector CT imaging of the neck was performed using the
standard protocol following the bolus administration of intravenous
contrast.

[Series 4: cor neck · coronal · 0.56mm/px · 3 of 105 slices shown]
[im 31/105  bone]
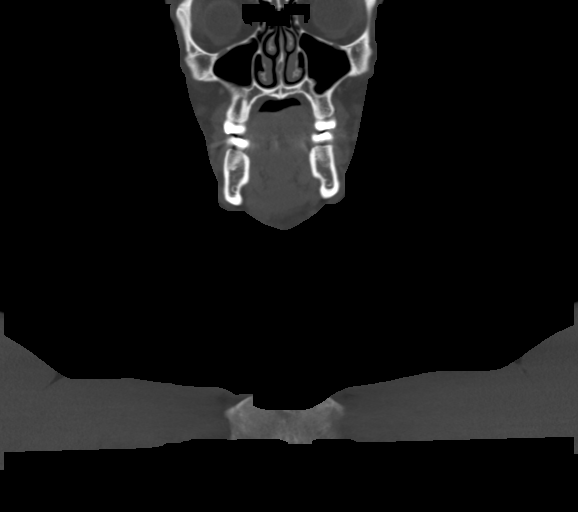
[im 45/105  bone]
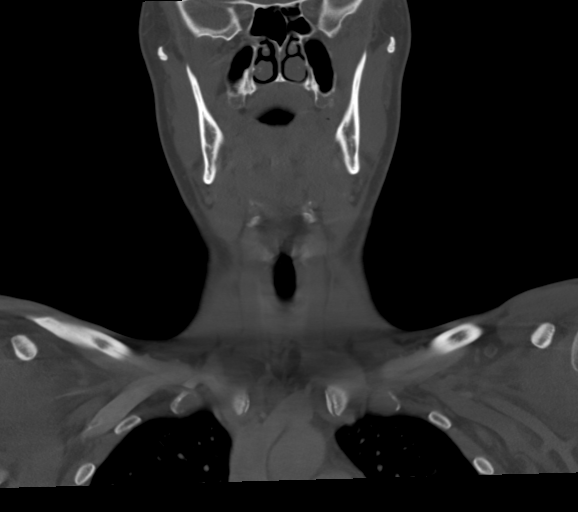
[im 60/105  bone]
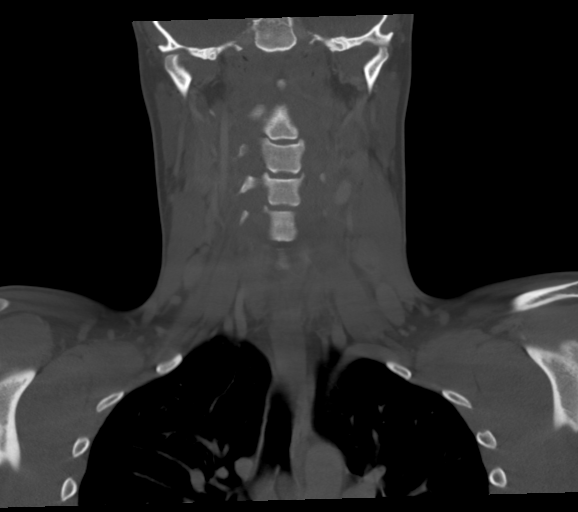

[Series 5: sag neck · sagittal · 0.41mm/px · 5 of 133 slices shown, 6 images]
[im 45/133  bone]
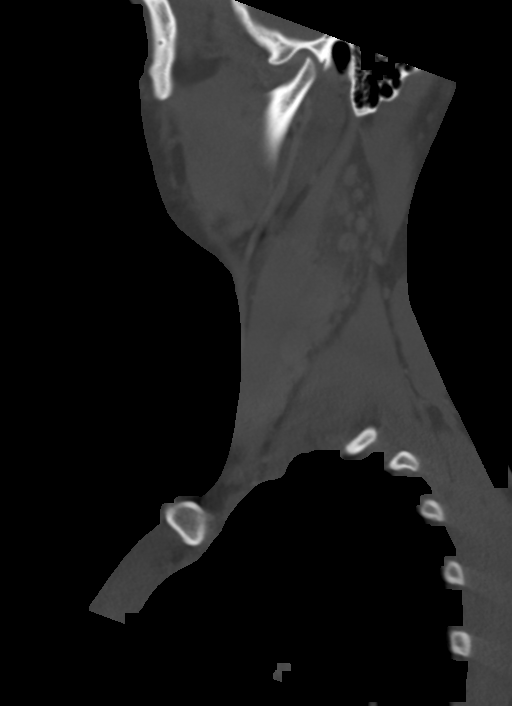
[im 56/133  bone]
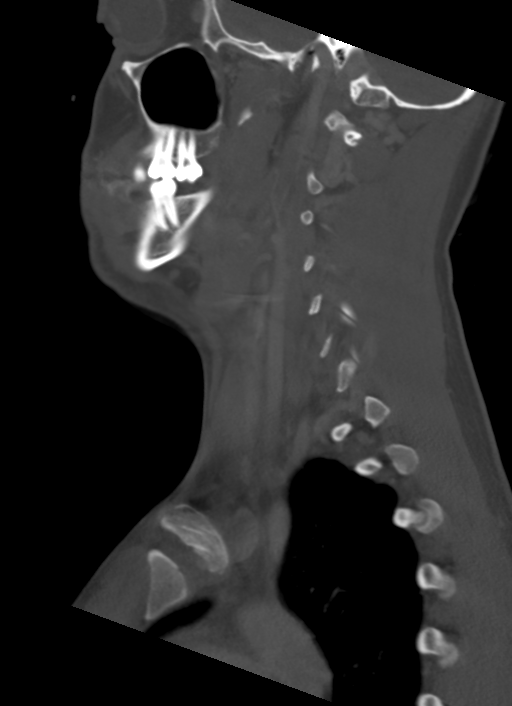
[im 67/133  soft-tissue]
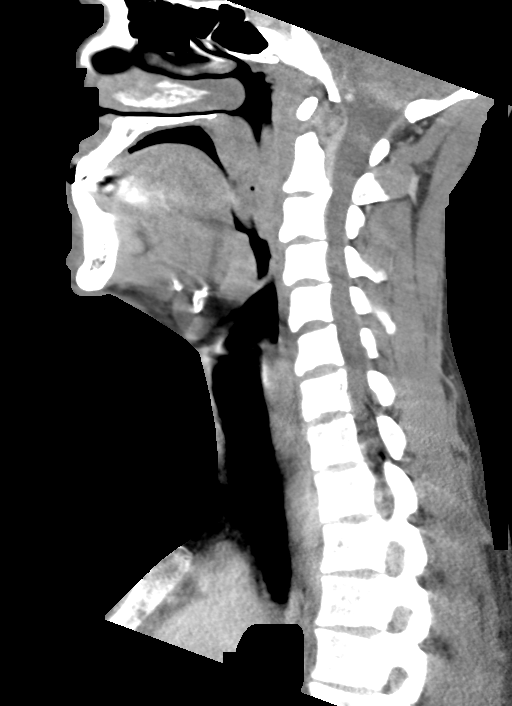
[im 67/133  bone]
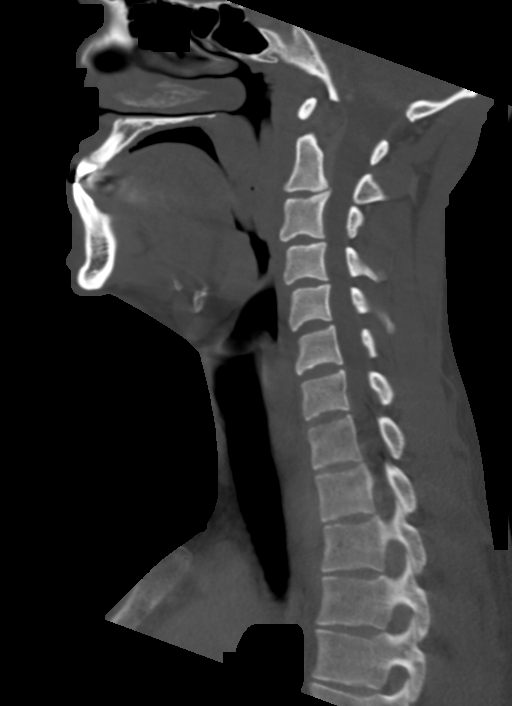
[im 78/133  bone]
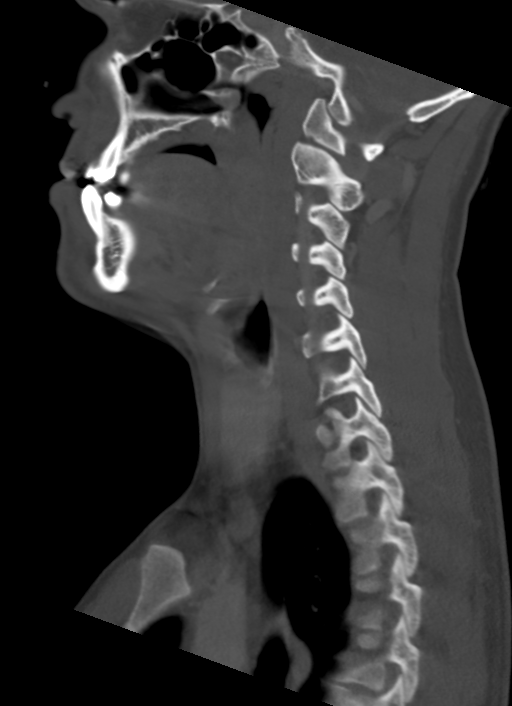
[im 89/133  bone]
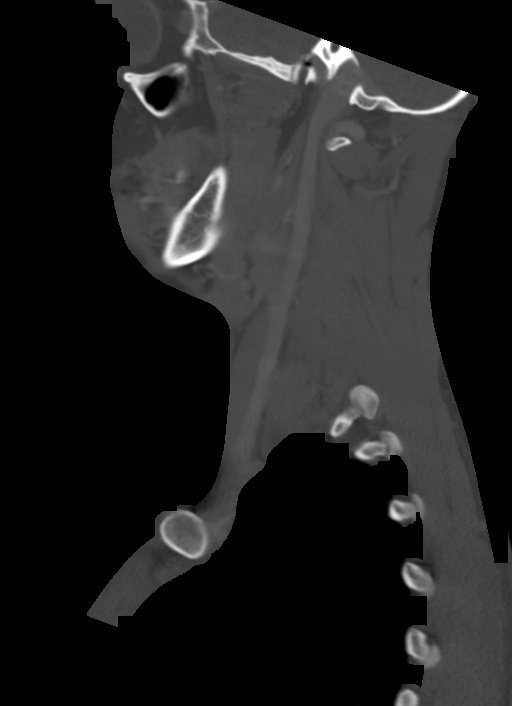

[Series 6: ax oropharynx · axial · 0.41mm/px · z∈[-277,-142]mm · 3 of 145 slices shown, 4 images]
[im 37/145  soft-tissue]
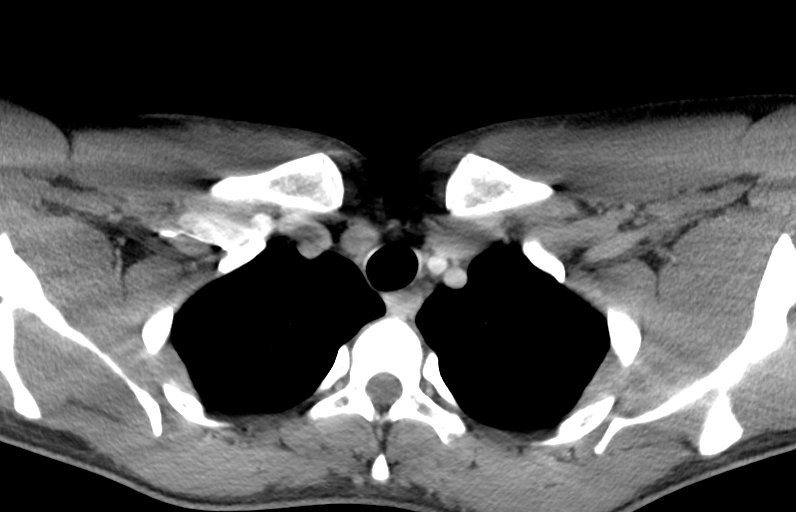
[im 37/145  bone]
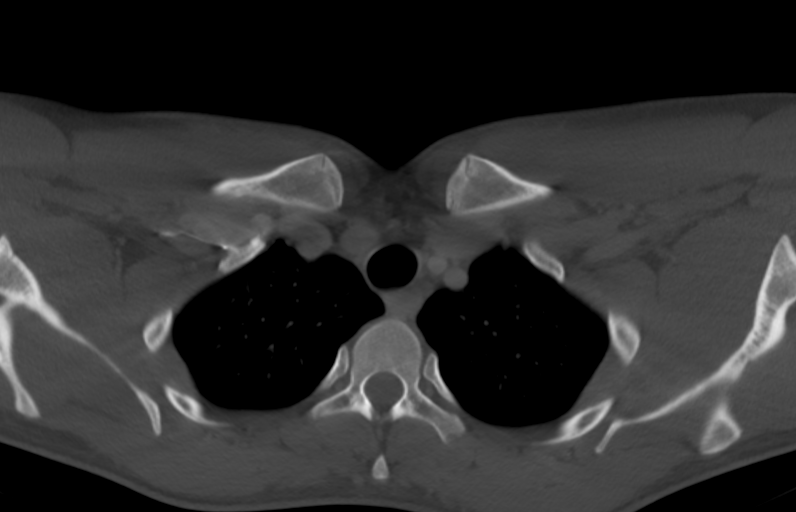
[im 73/145  bone]
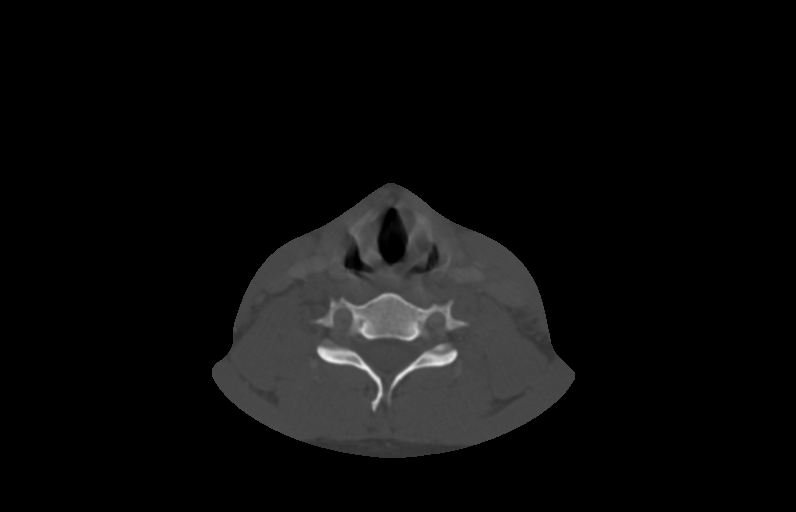
[im 109/145  bone]
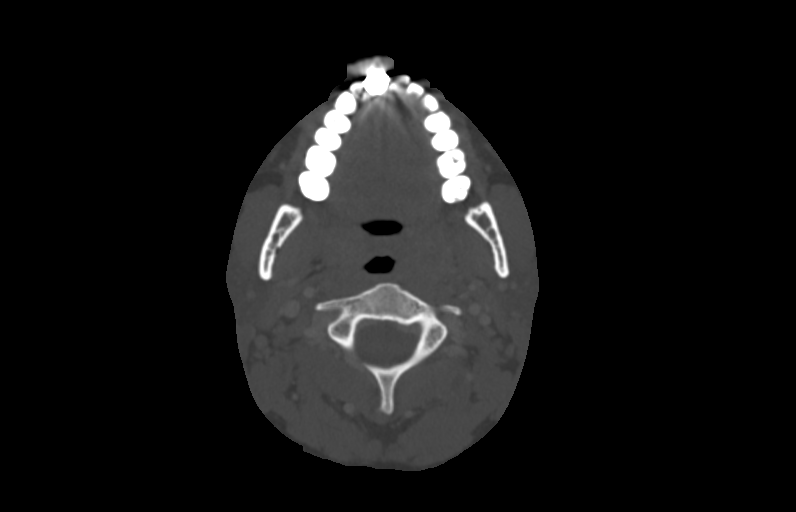

[11 of 33 positions shown; findings below may reference images not displayed]

RADIATION DOSE REDUCTION: This exam was performed according to the
departmental dose-optimization program which includes automated
exposure control, adjustment of the mA and/or kV according to
patient size and/or use of iterative reconstruction technique.

CONTRAST:  75mL OMNIPAQUE IOHEXOL 300 MG/ML  SOLN
FINDINGS: Mildly motion degraded exam.

Pharynx and larynx: Streak and beam hardening artifact arising from
dental restoration partially obscures the oral cavity. Symmetric
prominence of the palatine and lingual tonsils. No appreciable
swelling or discrete mass elsewhere within the oral cavity, pharynx
or larynx. No retropharyngeal collection.

Salivary glands: No inflammation, mass, or stone.

Thyroid: Subcentimeter nodule within the right thyroid lobe, not
meeting consensus criteria for ultrasound follow-up based on size.

Lymph nodes: Bilateral cervical lymphadenopathy. For instance, a
right level 2 lymph node measures 19 mm in short axis. A left level
2 lymph node measures 18 mm in short axis. A left level 2 lymph node
measures 16 mm in short axis.

Vascular: The major vascular structures of the neck are patent.

Limited intracranial: No evidence of acute intracranial abnormality
within the field of view.

Visualized orbits: No orbital mass or acute orbital finding.

Mastoids and visualized paranasal sinuses: No significant paranasal
sinus disease or mastoid effusion at the imaged levels.

Skeleton: No acute bony abnormality or aggressive osseous lesion.

Upper chest: No consolidation within the imaged lung apices.
IMPRESSION: Mildly motion degraded exam.

Symmetric prominence of the palatine and lingual tonsils. Associated
bilateral cervical lymphadenopathy with lymph nodes measuring up to
19 mm in short axis. This constellation of findings is nonspecific
and differential considerations include acute
tonsillitis/pharyngitis with reactive lymphadenopathy, infectious
mononucleosis or a lymphoproliferative process (such as lymphoma).
Clinical correlation is recommended. Additionally, close clinical
follow-up is recommended (with imaging follow-up as warranted) to
ensure resolution and exclude alternative etiologies.

## 2022-11-12 ENCOUNTER — Ambulatory Visit (HOSPITAL_BASED_OUTPATIENT_CLINIC_OR_DEPARTMENT_OTHER): Payer: 59 | Admitting: Psychologist

## 2022-11-12 ENCOUNTER — Encounter (HOSPITAL_COMMUNITY): Payer: Self-pay | Admitting: Psychologist

## 2022-11-12 DIAGNOSIS — F988 Other specified behavioral and emotional disorders with onset usually occurring in childhood and adolescence: Secondary | ICD-10-CM | POA: Diagnosis not present

## 2022-11-12 DIAGNOSIS — F4321 Adjustment disorder with depressed mood: Secondary | ICD-10-CM | POA: Diagnosis not present

## 2022-11-12 DIAGNOSIS — F810 Specific reading disorder: Secondary | ICD-10-CM

## 2022-11-12 NOTE — Progress Notes (Signed)
  [facility] [address] [place] [address] Dept: [phone] Dept Fax: [phone]  Psychology Therapy Session Progress Note  Patient ID: [name], male  DOB: 2001-05-28, 21 y.o.  MRN: [id number]  11/12/2022 Start time: 8 AM End time: 8:50 AM  Session #: In office psychotherapy session  Method of Visit: Face-to-Face  Present: father and patient  Service provided: 90834P Individual Psychotherapy (45 min.)  Current Concerns: Mild depression and anxiety significantly improved and in remission.  Academic struggles also significantly improved.  Conflict with best friend resolved.  Still waiting to hear regarding transferring universities for the fall.  Current Symptoms: Attention problem and Organization problem  Mental Status: Appearance: Well Groomed Attention: good  Motor Behavior: Normal Affect: Full Range Mood: normal Thought Process: normal Thought Content: normal Suicidal Ideation: None Homicidal Ideation:None Orientation: time, place, and person Insight: Fair Judgement: Good  Diagnosis: Adjustment disorder in remission, ADHD, dyslexia  Long Term Treatment Goals:  Treatment goals for ADHD: 1) decrease impulsivity 2) increase self-monitoring 3) increase organizational skills 4) increase time management skills 5) increased behavioral regulation 6) increase self-monitoring 7) utilized cognitive behavioral principles   Anticipated Frequency of Visits: Monthly Anticipated Length of Treatment Episode: 2 to 3 months  Treatment Intervention: Cognitive Behavioral therapy and Supportive therapy  Response to Treatment: Positive as evidenced by improved mood, as evidenced by all A's and his 5 college classes heading into exams, as evidenced by increased emotional equanimity.  Medical Necessity: Improved patient condition  Plan: CBT as needed, will transition treatment to rising  PGY 3 resident in July  Richardson Dubree. Mark Feras Gardella, PhD 11/12/2022

## 2022-12-17 ENCOUNTER — Encounter (HOSPITAL_COMMUNITY): Payer: Self-pay | Admitting: Psychologist

## 2022-12-17 ENCOUNTER — Ambulatory Visit (HOSPITAL_BASED_OUTPATIENT_CLINIC_OR_DEPARTMENT_OTHER): Payer: 59 | Admitting: Psychologist

## 2022-12-17 DIAGNOSIS — F988 Other specified behavioral and emotional disorders with onset usually occurring in childhood and adolescence: Secondary | ICD-10-CM

## 2022-12-17 DIAGNOSIS — F810 Specific reading disorder: Secondary | ICD-10-CM

## 2022-12-17 NOTE — Progress Notes (Signed)
  [facility] [address] [place] [address] Dept: [phone] Dept Fax: [phone]  Psychology Therapy Session Progress Note  Patient ID: [name], male  DOB: 05-20-2001, 22 y.o.  MRN: [id number]  12/17/2022 Start time: 8 AM End time: 8:50 AM  Session #: In office psychotherapy session  Method of Visit: Face-to-Face  Present: father and patient  Service provided: 90834P Individual Psychotherapy (45 min.)  Current Concerns: ADHD with weak and inconsistent executive functioning particularly in the area of metacognition.  Chronic irritability.  Dyslexia.  Current Symptoms: Attention problem and Organization problem  Mental Status: Appearance: Well Groomed Attention: good  Motor Behavior: Normal Affect: Full Range Mood: normal Thought Process: normal Thought Content: normal Suicidal Ideation: None Homicidal Ideation:None Orientation: time, place, and person Insight: Fair Judgement: Good  Diagnosis: ADHD, reading disorder (dyslexia)  Long Term Treatment Goals: Met  Anticipated Frequency of Visits: As needed Anticipated Length of Treatment Episode: As needed  Treatment Intervention: Cognitive Behavioral therapy and Psychoeducation  Response to Treatment: Positive as evidenced by 3.93 GPA last semester.  As evidenced by increased emotional equanimity.  Medical Necessity: Assisted patient to achieve or maintain maximum functional capacity  Plan: Initial treatment goals have been met.  Patient will continue medication consultation with medical provider.  Return to therapy in the future as needed.  Beatrix Fetters, PhD 12/17/2022

## 2023-02-09 ENCOUNTER — Encounter (HOSPITAL_BASED_OUTPATIENT_CLINIC_OR_DEPARTMENT_OTHER): Payer: Self-pay | Admitting: Emergency Medicine

## 2023-02-09 ENCOUNTER — Emergency Department (HOSPITAL_BASED_OUTPATIENT_CLINIC_OR_DEPARTMENT_OTHER)
Admission: EM | Admit: 2023-02-09 | Discharge: 2023-02-10 | Disposition: A | Discharge status: Home / Self Care | Payer: 59 | Attending: Emergency Medicine | Admitting: Emergency Medicine

## 2023-02-09 ENCOUNTER — Other Ambulatory Visit: Payer: Self-pay

## 2023-02-09 DIAGNOSIS — H9202 Otalgia, left ear: Secondary | ICD-10-CM | POA: Diagnosis not present

## 2023-02-09 DIAGNOSIS — H60332 Swimmer's ear, left ear: Secondary | ICD-10-CM

## 2023-02-09 NOTE — ED Triage Notes (Signed)
Left ear pain. Recently treated for ear infection (2 weeks ago) Better. Was swimming down the shore, and pain returned. Attempted to treat with peroxide, now feels like fluid is stuck in ear.  Muffled hearing on left side

## 2023-02-10 MED ORDER — NEOMYCIN-POLYMYXIN-HC 3.5-10000-1 OT SUSP: 4.0000 [drp] | Freq: Four times a day (QID) | OTIC | 0 refills | Status: AC

## 2023-02-10 NOTE — ED Provider Notes (Signed)
  [place] EMERGENCY DEPARTMENT AT [facility] Provider Note   CSN: [id number] Arrival date & time: 02/09/23  2318     History  Chief Complaint  Patient presents with   Otalgia    [name] is a 22 y.o. male.  Patient is a 22 year old male presenting with complaints of left ear pain.  This has been ongoing for the past 2 days.  He recently got home from spending time at the beach with a friend.  He was swimming in the ocean and swimming in the pool.  He reports some decreased hearing.  He tried using peroxide drops, however this did not help.  The history is provided by the patient.       Home Medications Prior to Admission medications   Medication Sig Start Date End Date Taking? Authorizing Provider  albuterol (VENTOLIN HFA) 108 (90 Base) MCG/ACT inhaler SMARTSIG:1 Puff(s) Via Inhaler Every 4 Hours PRN 10/03/20   [provider]  Budesonide 90 MCG/ACT inhaler Inhale 2 puffs into the lungs 2 (two) times daily. 03/14/21   Hunsucker, Lesia Sago, MD  fexofenadine (ALLEGRA) 180 MG tablet 1 tablet, stop claritin,    [provider]  GuanFACINE HCl 3 MG TB24 TAKE 1 TABLET BY MOUTH EACH DAY 08/26/22   Crump, Bobi A, NP  Serdexmethylphen-Dexmethylphen (AZSTARYS) 52.3-10.4 MG CAPS Take 1 capsule by mouth every morning. 09/02/22   Wonda Cheng A, NP      Allergies    Patient has no known allergies.    Review of Systems   Review of Systems  All other systems reviewed and are negative.   Physical Exam Updated Vital Signs BP (!) 139/92 (BP Location: Right Arm)   Pulse 85   Temp 98.3 F (36.8 C) (Oral)   Resp 18   SpO2 99%  Physical Exam Vitals and nursing note reviewed.  Constitutional:      Appearance: Normal appearance.  HENT:     Right Ear: Tympanic membrane and ear canal normal.     Left Ear: Tympanic membrane normal.     Ears:     Comments: The left ear canal is erythematous and inflamed.  There is some discomfort with speculum insertion and  manipulation of the external ear.  The TM itself appears normal, but only partially visualized. Pulmonary:     Effort: Pulmonary effort is normal.  Skin:    General: Skin is warm.  Neurological:     Mental Status: He is alert and oriented to person, place, and time.     ED Results / Procedures / Treatments   Labs (all labs ordered are listed, but only abnormal results are displayed) Labs Reviewed - No data to display  EKG None  Radiology No results found.  Procedures Procedures    Medications Ordered in ED Medications - No data to display  ED Course/ Medical Decision Making/ A&P  Patient presenting with left ear pain as described in the HPI.  Physical exam consistent with an otitis externa.  Patient to be treated with Cortisporin drops and follow-up as needed.  Final Clinical Impression(s) / ED Diagnoses Final diagnoses:  None    Rx / DC Orders ED Discharge Orders     None         Geoffery Lyons, MD 02/10/23 [phone]

## 2023-02-10 NOTE — Discharge Instructions (Signed)
Begin using Cortisporin drops as prescribed.  Follow-up with primary doctor if not improving in the next few days.

## 2023-12-25 ENCOUNTER — Encounter (HOSPITAL_COMMUNITY): Payer: Self-pay

## 2023-12-25 ENCOUNTER — Other Ambulatory Visit: Payer: Self-pay

## 2023-12-25 ENCOUNTER — Telehealth (HOSPITAL_COMMUNITY): Payer: Self-pay

## 2023-12-25 ENCOUNTER — Ambulatory Visit (HOSPITAL_COMMUNITY)
Admission: EM | Admit: 2023-12-25 | Discharge: 2023-12-25 | Disposition: A | Discharge status: Home / Self Care | Attending: Family Medicine | Admitting: Family Medicine

## 2023-12-25 DIAGNOSIS — J029 Acute pharyngitis, unspecified: Secondary | ICD-10-CM | POA: Insufficient documentation

## 2023-12-25 LAB — POCT RAPID STREP A (OFFICE): Rapid Strep A Screen: NEGATIVE

## 2023-12-25 LAB — POCT MONO SCREEN (KUC): Mono, POC: NEGATIVE

## 2023-12-25 MED ORDER — DEXAMETHASONE SODIUM PHOSPHATE 10 MG/ML IJ SOLN
10.0000 mg | Freq: Once | INTRAMUSCULAR | Status: AC
  Administered 2023-12-25: 10 mg via INTRAMUSCULAR

## 2023-12-25 MED ORDER — ACETAMINOPHEN 325 MG PO TABS
ORAL_TABLET | ORAL | Status: AC
  Filled 2023-12-25: qty 2

## 2023-12-25 MED ORDER — DEXAMETHASONE SODIUM PHOSPHATE 10 MG/ML IJ SOLN
INTRAMUSCULAR | Status: AC
  Filled 2023-12-25: qty 1

## 2023-12-25 MED ORDER — ACETAMINOPHEN 325 MG PO TABS
650.0000 mg | ORAL_TABLET | Freq: Once | ORAL | Status: AC
  Administered 2023-12-25: 650 mg via ORAL

## 2023-12-25 MED ORDER — LIDOCAINE VISCOUS HCL 2 % MT SOLN
15.0000 mL | Freq: Once | OROMUCOSAL | Status: AC
  Administered 2023-12-25: 15 mL via OROMUCOSAL

## 2023-12-25 MED ORDER — LIDOCAINE VISCOUS HCL 2 % MT SOLN
OROMUCOSAL | Status: AC
  Filled 2023-12-25: qty 15

## 2023-12-25 NOTE — ED Triage Notes (Addendum)
 Pt presents with complaints of sore throat and "swollen glands in the neck area" x 2 days. Pt currently rates his overall throat pain an 8/10. OTC Tylenol  + Ibuprofen  taken with no relief in pain. States it is difficult to swallow. Last dose of Tylenol  was this morning at 0330. Unsure of fevers at home, has felt warm.

## 2023-12-25 NOTE — Discharge Instructions (Addendum)
  1. Viral pharyngitis (Primary) - acetaminophen  (TYLENOL ) tablet 650 mg given in UC for acute fever - POCT rapid strep A performed in UC is negative for strep pharyngitis - POC mono screen performed in UC and is negative for mononucleosis - lidocaine  (XYLOCAINE ) 2 % viscous mouth solution 15 mL given in UC for acute throat pain and inflammation - dexamethasone  (DECADRON ) injection 10 mg given in UC for acute throat inflammation - Culture, group A strep (throat) sent to lab for further testing results should be available in 2 to 3 days if any abnormality noted you will be contacted appropriate guidance provided. -Continue to monitor symptoms for any change in severity if there is any escalation of current symptoms or development of new symptoms follow-up in ER for further evaluation and management.

## 2023-12-25 NOTE — ED Provider Notes (Signed)
 [facility]  Note:  This document was prepared using Dragon voice recognition software and may include unintentional dictation errors.  MRN: [id number] DOB: Apr 13, 2001  Subjective:   [name] is a 23 y.o. male presenting for sore throat and swollen lymph nodes x 2 days.  Patient states that a couple of days ago he was hanging out with his friends by the pool and drinking the next morning he woke up with a sore throat and has not improved today with Tylenol  or ibuprofen .  Patient reports increased difficulty with swallowing today.  Patient reports that he was unsure if he had any fever at home but felt warm.  Patient woke up this morning at 330 with increased throat pain and took Tylenol .  Patient denies any known exposure to strep or mono.  No current facility-administered medications for this encounter.  Current Outpatient Medications:    albuterol (VENTOLIN HFA) 108 (90 Base) MCG/ACT inhaler, SMARTSIG:1 Puff(s) Via Inhaler Every 4 Hours PRN, Disp: , Rfl:    Budesonide  90 MCG/ACT inhaler, Inhale 2 puffs into the lungs 2 (two) times daily., Disp: 1 each, Rfl: 6   fexofenadine ([name]) 180 MG tablet, 1 tablet, stop claritin,, Disp: , Rfl:    GuanFACINE  HCl 3 MG TB24, TAKE 1 TABLET BY MOUTH EACH DAY, Disp: 90 tablet, Rfl: 1   neomycin -polymyxin-hydrocortisone (CORTISPORIN) 3.5-10000-1 OTIC suspension, Place 4 drops into both ears 4 (four) times daily. X 7 days, Disp: 10 mL, Rfl: 0   Serdexmethylphen-Dexmethylphen (AZSTARYS ) 52.3-10.4 MG CAPS, Take 1 capsule by mouth every morning., Disp: 30 capsule, Rfl: 0   No Known Allergies  Past Medical History:  Diagnosis Date   ADHD (attention deficit hyperactivity disorder)    ADHD (attention deficit hyperactivity disorder), combined type 12/05/2015   Asthma    Dysgraphia 11/21/2015   Learning difficulty    Memory deficit    Otitis    Sepsis (HCC)    neonatal, hospitalized for two weeks at 49 days of age     Past Surgical  History:  Procedure Laterality Date   LAPAROSCOPIC APPENDECTOMY N/A 11/07/2017   Procedure: APPENDECTOMY LAPAROSCOPIC;  Surgeon: Andre Allegra, MD;  Location: [facility] OR;  Service: Pediatrics;  Laterality: N/A;    Family History  Problem Relation Age of Onset   ADD / ADHD Mother    Neurofibromatosis Mother 78       NF type 3   Alcohol  abuse Mother        recovering   Depression Mother    Anxiety disorder Mother    ADD / ADHD Brother    Anxiety disorder Brother    Cancer Maternal Grandmother        breast   Mental illness Maternal Grandmother    Neurofibromatosis Maternal Grandfather    Depression Maternal Grandfather    Cancer Paternal Grandmother        lung   Heart disease Paternal Grandfather     Social History   Tobacco Use   Smoking status: Some Days    Types: Cigars    Passive exposure: Never   Smokeless tobacco: Never  Vaping Use   Vaping status: Never Used  Substance Use Topics   Alcohol  use: Yes    Alcohol /week: 1.0 standard drink of alcohol     Types: 1 Cans of beer per week    Comment: occassionally   Drug use: Not Currently    Types: Marijuana    ROS Refer to HPI for ROS details.  Objective:   Vitals:  BP 117/62 (BP Location: Right Arm)   Pulse (!) 103   Temp 99.2 F (37.3 C) (Oral)   Resp 18   Ht 6' (1.829 m)   Wt 190 lb (86.2 kg)   SpO2 95%   BMI 25.77 kg/m   Physical Exam Vitals and nursing note reviewed.  Constitutional:      General: He is not in acute distress.    Appearance: Normal appearance. He is well-developed. He is not ill-appearing or toxic-appearing.  HENT:     Head: Normocephalic and atraumatic.     Nose: No congestion or rhinorrhea.     Mouth/Throat:     Mouth: Mucous membranes are moist.     Pharynx: Oropharyngeal exudate and posterior oropharyngeal erythema present.  Eyes:     General:        Right eye: No discharge.        Left eye: No discharge.     Extraocular Movements: Extraocular movements intact.      Conjunctiva/sclera: Conjunctivae normal.  Cardiovascular:     Rate and Rhythm: Normal rate.  Pulmonary:     Effort: Pulmonary effort is normal. No respiratory distress.  Musculoskeletal:     Cervical back: Normal range of motion and neck supple. Tenderness present. No rigidity.  Lymphadenopathy:     Cervical: Cervical adenopathy present.  Skin:    General: Skin is warm and dry.     Capillary Refill: Capillary refill takes less than 2 seconds.  Neurological:     General: No focal deficit present.     Mental Status: He is alert and oriented to person, place, and time.  Psychiatric:        Mood and Affect: Mood normal.        Behavior: Behavior normal.     Procedures  Results for orders placed or performed during the hospital encounter of 12/25/23 (from the past 24 hours)  POCT rapid strep A     Status: None   Collection Time: 12/25/23  2:56 PM  Result Value Ref Range   Rapid Strep A Screen Negative Negative  POC mono screen     Status: None   Collection Time: 12/25/23  3:01 PM  Result Value Ref Range   Mono, POC Negative Negative    No results found.   Assessment and Plan :     Discharge Instructions       1. Viral pharyngitis (Primary) - acetaminophen  (TYLENOL ) tablet 650 mg given in UC for acute fever - POCT rapid strep A performed in UC is negative for strep pharyngitis - POC mono screen performed in UC and is negative for mononucleosis - lidocaine  (XYLOCAINE ) 2 % viscous mouth solution 15 mL given in UC for acute throat pain and inflammation - dexamethasone  (DECADRON ) injection 10 mg given in UC for acute throat inflammation - Culture, group A strep (throat) sent to lab for further testing results should be available in 2 to 3 days if any abnormality noted you will be contacted appropriate guidance provided. -Continue to monitor symptoms for any change in severity if there is any escalation of current symptoms or development of new symptoms follow-up in ER for  further evaluation and management.    [name]   [name], [name], [name] 12/25/23 [phone]

## 2023-12-25 NOTE — Telephone Encounter (Signed)
 Informed by front desk staff that Patient calling in requesting medication sent in during today's visit be sent to another pharmacy as his main pharmacy is closed for the holiday weekend.   Inquired with the provider who saw the Patient. No medications were sent in as he was given an injection in office. Provider states an anti-inflammatory medication can be sent in or the Patient can take otc pain medication as needed.   Called and informed the Patient of the above. Patient verbalized understanding and states that he thinks he will be fine with OTC medications.   Patient informed to avoid NSAIDs for the next 8-12 hours due to the injection given in office per the provider. Patient verbalized understanding.

## 2023-12-28 LAB — CULTURE, GROUP A STREP (THRC)

## 2023-12-29 ENCOUNTER — Ambulatory Visit (HOSPITAL_COMMUNITY): Payer: Self-pay
